# Patient Record
Sex: Female | Born: 1935 | Race: Black or African American | Hispanic: No | Marital: Married | Smoking: Former smoker
Health system: Southern US, Community
[De-identification: ages and names within clinical notes are randomized; demographics above are authoritative.]

## PROBLEM LIST (undated history)

## (undated) DIAGNOSIS — G309 Alzheimer's disease, unspecified: Secondary | ICD-10-CM

## (undated) DIAGNOSIS — J811 Chronic pulmonary edema: Secondary | ICD-10-CM

## (undated) DIAGNOSIS — F0280 Dementia in other diseases classified elsewhere without behavioral disturbance: Secondary | ICD-10-CM

## (undated) DIAGNOSIS — M1990 Unspecified osteoarthritis, unspecified site: Secondary | ICD-10-CM

## (undated) DIAGNOSIS — H8110 Benign paroxysmal vertigo, unspecified ear: Secondary | ICD-10-CM

## (undated) DIAGNOSIS — Z8639 Personal history of other endocrine, nutritional and metabolic disease: Secondary | ICD-10-CM

## (undated) DIAGNOSIS — I10 Essential (primary) hypertension: Secondary | ICD-10-CM

## (undated) DIAGNOSIS — Q899 Congenital malformation, unspecified: Secondary | ICD-10-CM

## (undated) DIAGNOSIS — F419 Anxiety disorder, unspecified: Secondary | ICD-10-CM

## (undated) DIAGNOSIS — E785 Hyperlipidemia, unspecified: Secondary | ICD-10-CM

## (undated) DIAGNOSIS — I509 Heart failure, unspecified: Secondary | ICD-10-CM

## (undated) DIAGNOSIS — I2510 Atherosclerotic heart disease of native coronary artery without angina pectoris: Secondary | ICD-10-CM

## (undated) DIAGNOSIS — N289 Disorder of kidney and ureter, unspecified: Secondary | ICD-10-CM

## (undated) HISTORY — DX: Benign paroxysmal vertigo, unspecified ear: H81.10

## (undated) HISTORY — PX: HERNIA REPAIR: SHX51

## (undated) HISTORY — DX: Anxiety disorder, unspecified: F41.9

## (undated) HISTORY — DX: Congenital malformation, unspecified: Q89.9

## (undated) HISTORY — DX: Disorder of kidney and ureter, unspecified: N28.9

## (undated) HISTORY — DX: Dementia in other diseases classified elsewhere, unspecified severity, without behavioral disturbance, psychotic disturbance, mood disturbance, and anxiety: F02.80

## (undated) HISTORY — DX: Atherosclerotic heart disease of native coronary artery without angina pectoris: I25.10

## (undated) HISTORY — PX: PARATHYROIDECTOMY: SHX19

## (undated) HISTORY — DX: Unspecified osteoarthritis, unspecified site: M19.90

## (undated) HISTORY — DX: Hyperlipidemia, unspecified: E78.5

## (undated) HISTORY — DX: Essential (primary) hypertension: I10

## (undated) HISTORY — PX: CATARACT EXTRACTION, BILATERAL: SHX1313

## (undated) HISTORY — DX: Personal history of other endocrine, nutritional and metabolic disease: Z86.39

## (undated) HISTORY — DX: Chronic pulmonary edema: J81.1

## (undated) HISTORY — DX: Alzheimer's disease, unspecified: G30.9

---

## 1999-07-13 ENCOUNTER — Other Ambulatory Visit: Admission: RE | Admit: 1999-07-13 | Discharge: 1999-07-13 | Payer: Self-pay | Admitting: Family Medicine

## 1999-10-30 ENCOUNTER — Encounter: Payer: Self-pay | Admitting: Family Medicine

## 1999-10-30 ENCOUNTER — Encounter: Admission: RE | Admit: 1999-10-30 | Discharge: 1999-10-30 | Payer: Self-pay | Admitting: Family Medicine

## 2000-05-05 ENCOUNTER — Encounter: Payer: Self-pay | Admitting: Emergency Medicine

## 2000-05-05 ENCOUNTER — Encounter: Payer: Self-pay | Admitting: Family Medicine

## 2000-05-05 ENCOUNTER — Inpatient Hospital Stay (HOSPITAL_COMMUNITY): Admission: EM | Admit: 2000-05-05 | Discharge: 2000-05-06 | Payer: Self-pay | Admitting: Emergency Medicine

## 2000-08-05 ENCOUNTER — Other Ambulatory Visit: Admission: RE | Admit: 2000-08-05 | Discharge: 2000-08-05 | Payer: Self-pay | Admitting: Family Medicine

## 2001-06-02 ENCOUNTER — Other Ambulatory Visit: Admission: RE | Admit: 2001-06-02 | Discharge: 2001-06-02 | Payer: Self-pay | Admitting: Family Medicine

## 2001-06-07 ENCOUNTER — Encounter: Admission: RE | Admit: 2001-06-07 | Discharge: 2001-06-07 | Payer: Self-pay | Admitting: Family Medicine

## 2001-06-07 ENCOUNTER — Encounter: Payer: Self-pay | Admitting: Family Medicine

## 2002-06-12 ENCOUNTER — Encounter: Payer: Self-pay | Admitting: Cardiology

## 2002-06-12 ENCOUNTER — Inpatient Hospital Stay (HOSPITAL_COMMUNITY): Admission: EM | Admit: 2002-06-12 | Discharge: 2002-06-14 | Payer: Self-pay | Admitting: *Deleted

## 2002-06-13 ENCOUNTER — Encounter: Payer: Self-pay | Admitting: Cardiology

## 2002-07-27 ENCOUNTER — Other Ambulatory Visit: Admission: RE | Admit: 2002-07-27 | Discharge: 2002-07-27 | Payer: Self-pay | Admitting: Family Medicine

## 2003-02-02 ENCOUNTER — Inpatient Hospital Stay (HOSPITAL_COMMUNITY): Admission: EM | Admit: 2003-02-02 | Discharge: 2003-02-12 | Payer: Self-pay

## 2003-02-03 ENCOUNTER — Encounter: Payer: Self-pay | Admitting: Pulmonary Disease

## 2003-02-04 ENCOUNTER — Encounter: Payer: Self-pay | Admitting: Critical Care Medicine

## 2003-02-04 ENCOUNTER — Encounter: Payer: Self-pay | Admitting: Cardiology

## 2003-02-05 ENCOUNTER — Encounter: Payer: Self-pay | Admitting: Cardiology

## 2003-02-10 ENCOUNTER — Encounter: Payer: Self-pay | Admitting: Family Medicine

## 2004-09-03 ENCOUNTER — Ambulatory Visit: Payer: Self-pay | Admitting: Cardiology

## 2004-10-05 ENCOUNTER — Ambulatory Visit: Payer: Self-pay | Admitting: *Deleted

## 2004-10-05 ENCOUNTER — Ambulatory Visit: Payer: Self-pay

## 2005-08-30 ENCOUNTER — Ambulatory Visit: Payer: Self-pay | Admitting: Cardiology

## 2005-10-04 ENCOUNTER — Ambulatory Visit: Payer: Self-pay | Admitting: Cardiology

## 2006-09-23 ENCOUNTER — Ambulatory Visit: Payer: Self-pay | Admitting: Cardiology

## 2006-09-23 LAB — CONVERTED CEMR LAB
Bilirubin Urine: NEGATIVE
Crystals: NEGATIVE
Nitrite: NEGATIVE
RBC / HPF: NONE SEEN
Total Protein, Urine: 30 mg/dL — AB
pH: 5.5 (ref 5.0–8.0)

## 2007-04-04 ENCOUNTER — Ambulatory Visit: Payer: Self-pay | Admitting: Cardiology

## 2007-04-12 ENCOUNTER — Ambulatory Visit: Payer: Self-pay | Admitting: Cardiology

## 2007-04-12 LAB — CONVERTED CEMR LAB
Calcium: 10 mg/dL (ref 8.4–10.5)
Chloride: 103 meq/L (ref 96–112)
GFR calc Af Amer: 55 mL/min
GFR calc non Af Amer: 45 mL/min
Glucose, Bld: 129 mg/dL — ABNORMAL HIGH (ref 70–99)
Sodium: 141 meq/L (ref 135–145)

## 2007-05-10 ENCOUNTER — Ambulatory Visit: Payer: Self-pay | Admitting: Cardiology

## 2007-05-10 LAB — CONVERTED CEMR LAB
BUN: 19 mg/dL (ref 6–23)
CO2: 30 meq/L (ref 19–32)
Calcium: 9.8 mg/dL (ref 8.4–10.5)
Chloride: 109 meq/L (ref 96–112)
Creatinine, Ser: 1.2 mg/dL (ref 0.4–1.2)
GFR calc non Af Amer: 45 mL/min
Sodium: 146 meq/L — ABNORMAL HIGH (ref 135–145)

## 2007-09-25 ENCOUNTER — Ambulatory Visit: Payer: Self-pay | Admitting: Cardiology

## 2007-11-20 ENCOUNTER — Ambulatory Visit: Payer: Self-pay | Admitting: Cardiology

## 2007-11-20 LAB — CONVERTED CEMR LAB
ALT: 18 units/L (ref 0–35)
AST: 27 units/L (ref 0–37)
Albumin: 3.9 g/dL (ref 3.5–5.2)
Alkaline Phosphatase: 38 units/L — ABNORMAL LOW (ref 39–117)
Bilirubin, Direct: 0.1 mg/dL (ref 0.0–0.3)
Total Bilirubin: 1 mg/dL (ref 0.3–1.2)

## 2008-04-01 ENCOUNTER — Encounter: Payer: Self-pay | Admitting: Cardiology

## 2008-04-02 ENCOUNTER — Ambulatory Visit: Payer: Self-pay | Admitting: Cardiology

## 2008-04-02 LAB — CONVERTED CEMR LAB
AST: 23 units/L (ref 0–37)
Albumin: 4.2 g/dL (ref 3.5–5.2)
CO2: 30 meq/L (ref 19–32)
GFR calc Af Amer: 39 mL/min
Glucose, Bld: 91 mg/dL (ref 70–99)
Potassium: 4.7 meq/L (ref 3.5–5.1)
Sodium: 143 meq/L (ref 135–145)

## 2008-10-07 ENCOUNTER — Ambulatory Visit: Payer: Self-pay | Admitting: Cardiology

## 2008-10-07 LAB — CONVERTED CEMR LAB
CO2: 32 meq/L (ref 19–32)
Calcium: 9.7 mg/dL (ref 8.4–10.5)
Chloride: 104 meq/L (ref 96–112)
Creatinine, Ser: 1.7 mg/dL — ABNORMAL HIGH (ref 0.4–1.2)
GFR calc Af Amer: 37 mL/min
Potassium: 4.5 meq/L (ref 3.5–5.1)
Sodium: 142 meq/L (ref 135–145)

## 2008-11-07 DIAGNOSIS — E785 Hyperlipidemia, unspecified: Secondary | ICD-10-CM

## 2008-11-07 DIAGNOSIS — I5022 Chronic systolic (congestive) heart failure: Secondary | ICD-10-CM

## 2008-12-11 ENCOUNTER — Encounter: Admission: RE | Admit: 2008-12-11 | Discharge: 2008-12-11 | Payer: Self-pay | Admitting: *Deleted

## 2009-04-17 ENCOUNTER — Ambulatory Visit: Payer: Self-pay | Admitting: Cardiology

## 2009-04-17 DIAGNOSIS — I10 Essential (primary) hypertension: Secondary | ICD-10-CM

## 2009-04-30 ENCOUNTER — Telehealth: Payer: Self-pay | Admitting: Cardiology

## 2009-07-15 ENCOUNTER — Encounter: Admission: RE | Admit: 2009-07-15 | Discharge: 2009-07-15 | Payer: Self-pay | Admitting: Internal Medicine

## 2010-04-27 ENCOUNTER — Ambulatory Visit: Payer: Self-pay | Admitting: Internal Medicine

## 2010-04-27 DIAGNOSIS — N183 Chronic kidney disease, stage 3 (moderate): Secondary | ICD-10-CM

## 2010-04-27 DIAGNOSIS — M109 Gout, unspecified: Secondary | ICD-10-CM

## 2010-04-27 LAB — CONVERTED CEMR LAB
BUN: 41 mg/dL — ABNORMAL HIGH (ref 6–23)
CO2: 31 meq/L (ref 19–32)
Chloride: 109 meq/L (ref 96–112)
GFR calc non Af Amer: 28.7 mL/min (ref >60)
Glucose, Bld: 101 mg/dL — ABNORMAL HIGH (ref 70–99)
Potassium: 4.5 meq/L (ref 3.5–5.1)
Sodium: 147 meq/L — ABNORMAL HIGH (ref 135–145)

## 2010-05-01 ENCOUNTER — Telehealth: Payer: Self-pay | Admitting: Internal Medicine

## 2010-05-14 ENCOUNTER — Ambulatory Visit: Payer: Self-pay | Admitting: Cardiology

## 2010-05-14 ENCOUNTER — Telehealth: Payer: Self-pay | Admitting: Internal Medicine

## 2010-08-17 IMAGING — US US RENAL
1 series · 14 of 25 positions shown · non-contrast
Comparison: None

CLINICAL DATA: Abnormal renal laboratory values, hypertension

RENAL/URINARY TRACT ULTRASOUND COMPLETE

[Series 1: us renal · 0.23mm/px · 14 of 49 slices shown]
[im 1/49]
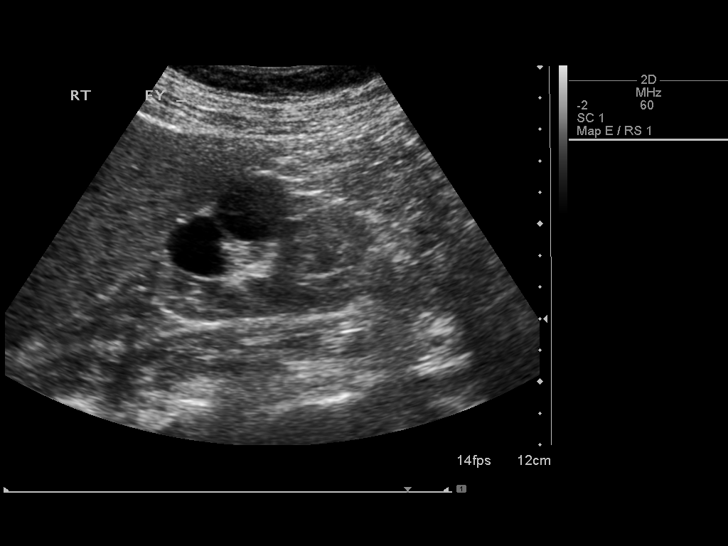
[im 5/49]
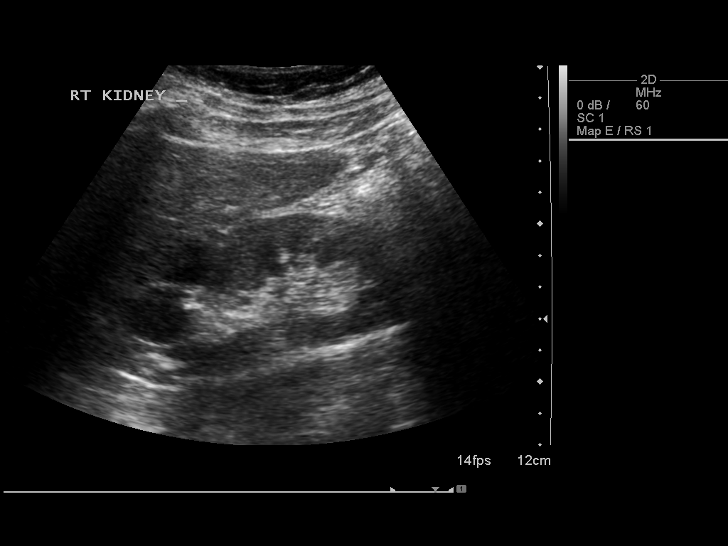
[im 9/49]
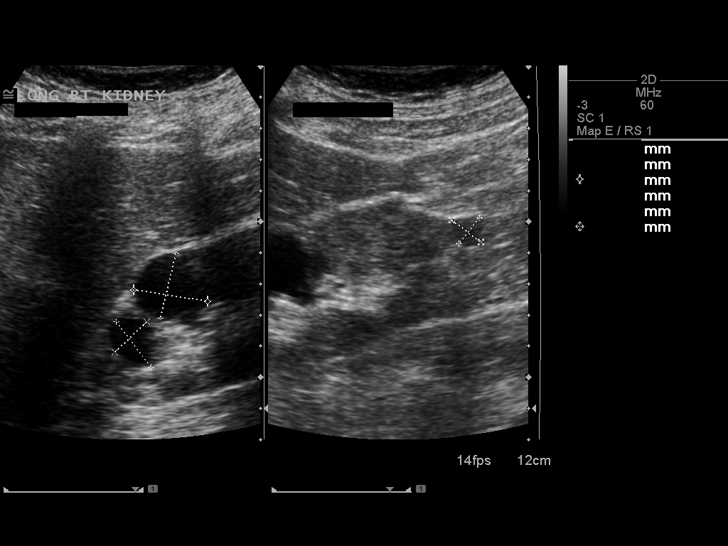
[im 13/49]
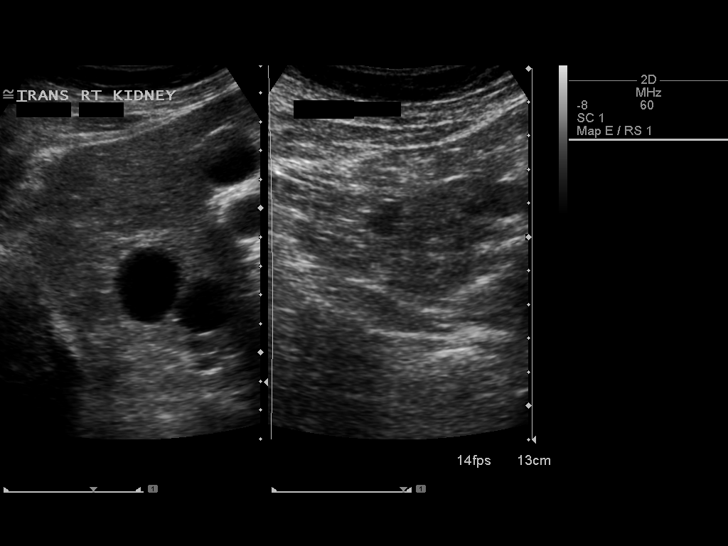
[im 17/49]
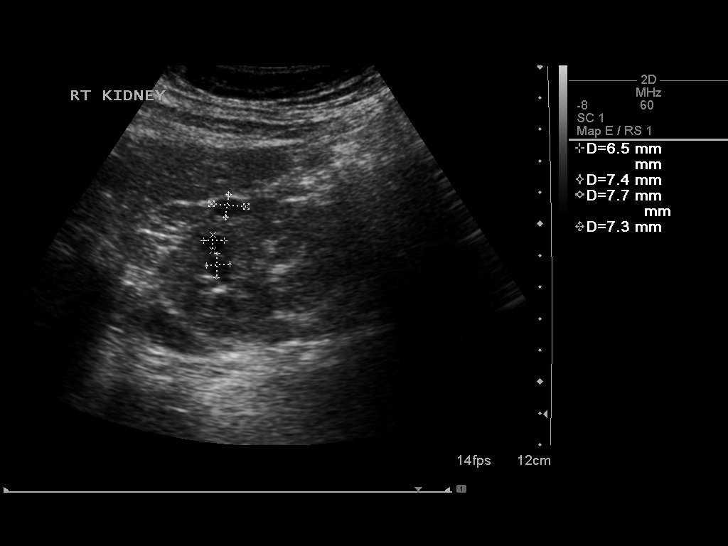
[im 19/49]
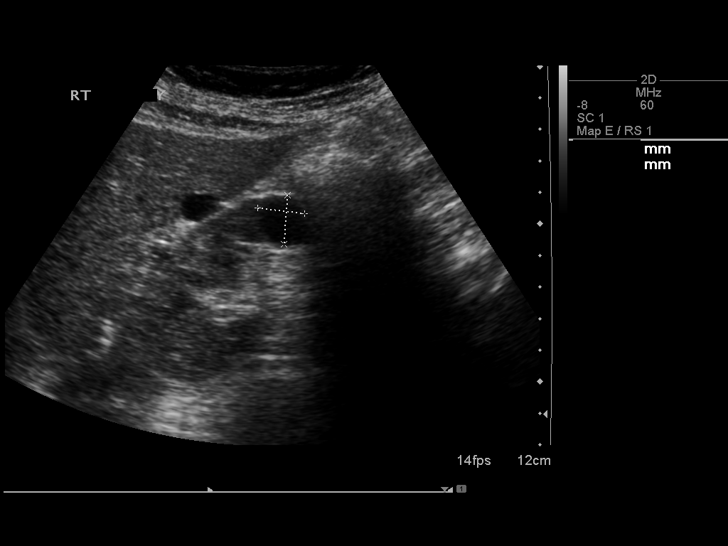
[im 23/49]
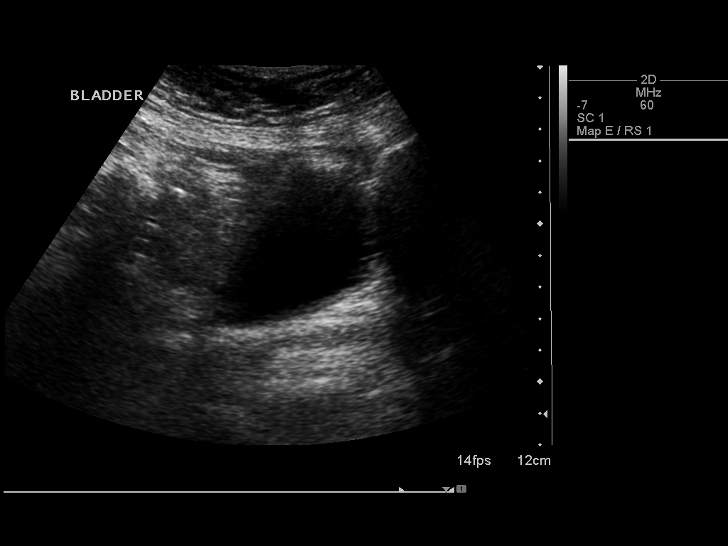
[im 27/49]
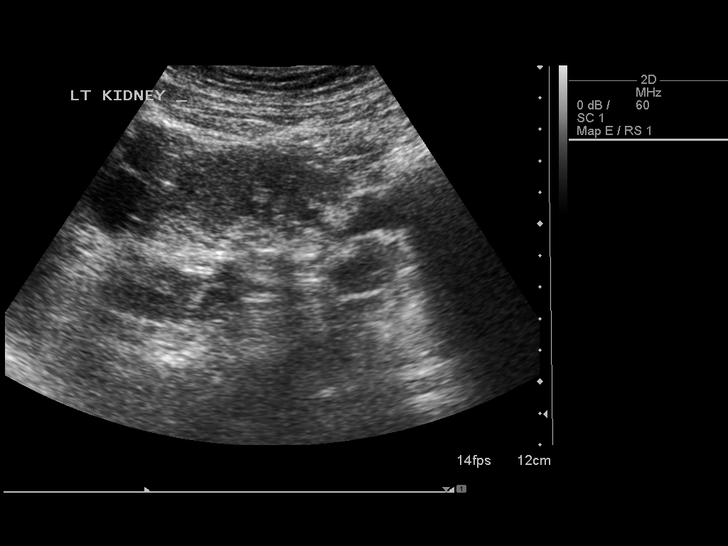
[im 31/49]
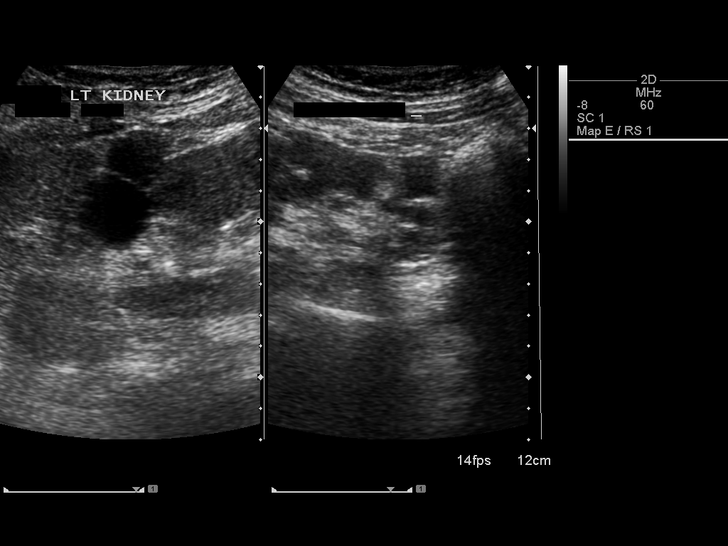
[im 33/49]
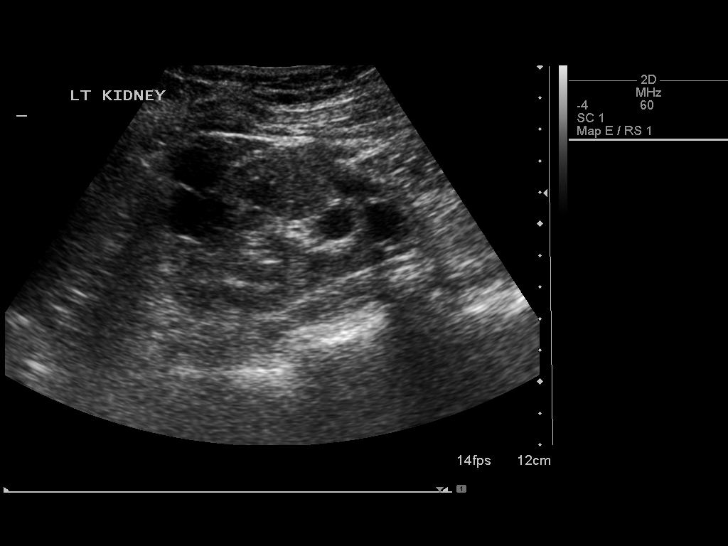
[im 37/49]
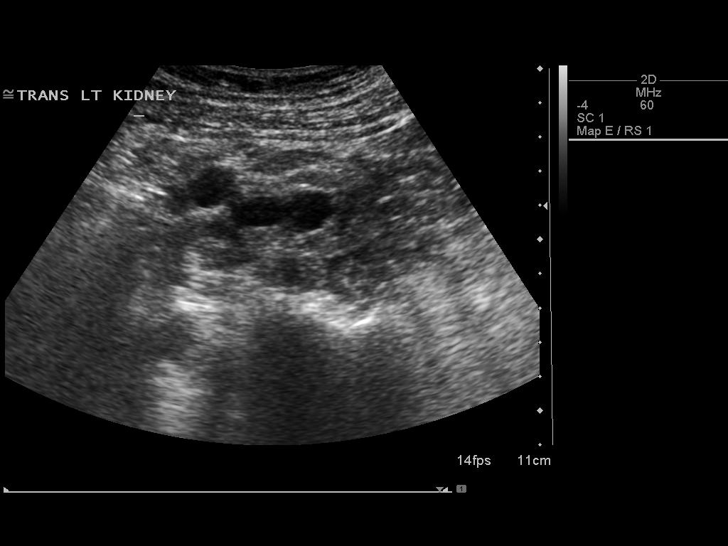
[im 41/49]
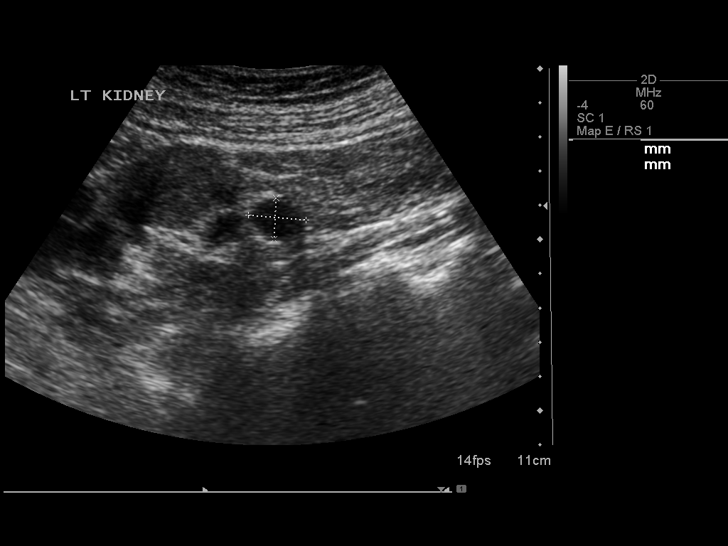
[im 45/49]
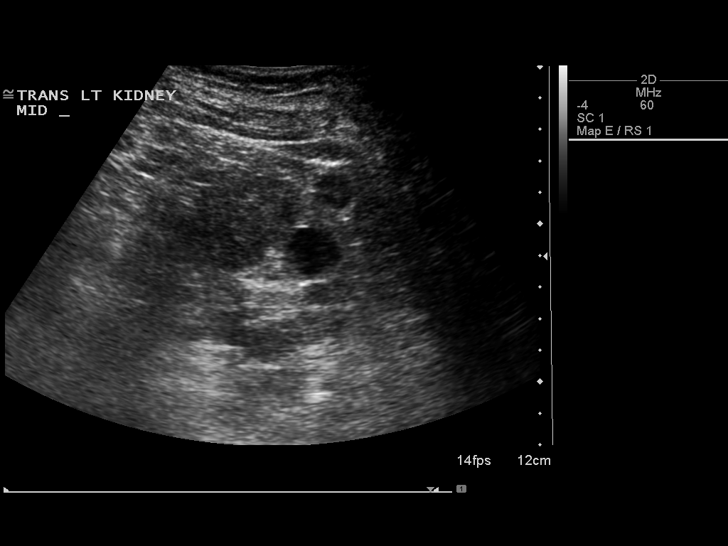
[im 49/49]
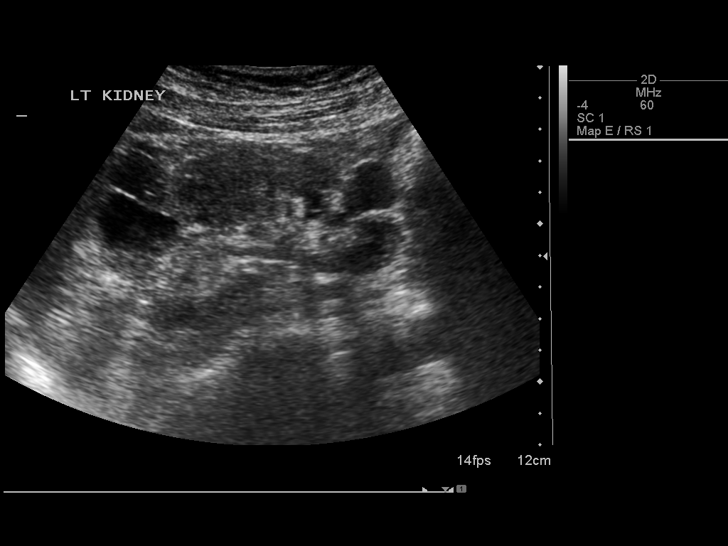

[14 of 25 positions shown; findings below may reference images not displayed]

FINDINGS: Right Kidney:  No hydronephrosis is seen.  The right kidney
measures 9.3 cm sagittally.  Multiple renal cysts are noted the
largest in the upper pole measuring 2.4 x 2.1 x 2.2 cm.

Left Kidney:  No hydronephrosis.  The left kidney measures 9.7 cm.
Multiple renal cysts are noted, largest in the upper pole of 2.7 x
2.4 x 2.4 cm.  No solid renal lesion is seen.

Bladder:  The urinary bladder is unremarkable
IMPRESSION: .
1.  No hydronephrosis.
2.  Multiple bilateral renal cysts.  No solid renal lesion is seen.

## 2010-09-15 NOTE — Progress Notes (Signed)
  Phone Note Refill Request Message from:  Fax from Pharmacy on May 01, 2010 4:21 PM  Refills Requested: Medication #1:  LORAZEPAM 0.5 MG TABS as needed   Supply Requested: 6 months  Medication #2:  TRAZODONE HCL 50 MG TABS as needed   Supply Requested: 6 months Please Advise refill for pt  Initial call taken by: Ami Bullins CMA,  May 01, 2010 4:22 PM  Follow-up for Phone Call        ok Follow-up by: Etta Grandchild MD,  May 01, 2010 4:22 PM  Additional Follow-up for Phone Call Additional follow up Details #1::        How many fills ;for 6 months Additional Follow-up by: Ami Bullins CMA,  May 01, 2010 4:24 PM    Prescriptions: TRAZODONE HCL 50 MG TABS (TRAZODONE HCL) as needed  #30 x 3   Entered by:   Ami Bullins CMA   Authorized by:   Etta Grandchild MD   Signed by:   Bill Salinas CMA on 05/01/2010   Method used:   Telephoned to ...       [facility]       [address]       [place], [place]  [address]       Ph: [phone]       Fax: [phone]   RxID:   [id number] LORAZEPAM 0.5 MG TABS (LORAZEPAM) as needed  #30 x 3   Entered by:   Bill Salinas CMA   Authorized by:   Etta Grandchild MD   Signed by:   Bill Salinas CMA on 05/01/2010   Method used:   Telephoned to ...       [facility]       [address]       [place], [place]  [address]       Ph: [phone]       Fax: [phone]   RxID:   [id number]

## 2010-09-15 NOTE — Progress Notes (Signed)
  Phone Note Refill Request Message from:  Fax from Pharmacy on May 01, 2010 4:20 PM  Refills Requested: Medication #1:  NAMENDA 10 MG TABS daily  Medication #2:  ARICEPT 10 MG TABS daily  Medication #3:  LEXAPRO 10 MG TABS Take 1 tablet by mouth once a day. Initial call taken by: Ami Bullins CMA,  May 01, 2010 4:21 PM    Prescriptions: LEXAPRO 10 MG TABS (ESCITALOPRAM OXALATE) Take 1 tablet by mouth once a day  #30 x 6   Entered by:   Ami Bullins CMA   Authorized by:   Etta Grandchild MD   Signed by:   Bill Salinas CMA on 05/01/2010   Method used:   Electronically to        [facility] (retail)       [address]       [place], [place]  [address]       Ph: [phone]       Fax: [phone]   RxID:   [id number] ARICEPT 10 MG TABS (DONEPEZIL HCL) daily  #30 x 6   Entered by:   Bill Salinas CMA   Authorized by:   Etta Grandchild MD   Signed by:   Bill Salinas CMA on 05/01/2010   Method used:   Electronically to        [facility] (retail)       [address]       [place], [place]  [address]       Ph: [phone]       Fax: [phone]   RxID:   [id number] NAMENDA 10 MG TABS (MEMANTINE HCL) daily  #30 x 6   Entered by:   Bill Salinas CMA   Authorized by:   Etta Grandchild MD   Signed by:   Bill Salinas CMA on 05/01/2010   Method used:   Electronically to        [facility] (retail)       [address]       [place], [place]  [address]       Ph: [phone]       Fax: [phone]   RxID:   [id number]

## 2010-09-15 NOTE — Progress Notes (Signed)
Summary: NAMENDA  Phone Note Call from Patient   Caller: Daughter - Derek Jack [address] 2002 Summary of Call: Pt's daughter called, pt takes namenda  1 two times a day, Ok to update EMR and call in new rx?   Dtr is req a call when complete Initial call taken by: Lamar Sprinkles, CMA,  May 14, 2010 12:11 PM  Follow-up for Phone Call        yes Follow-up by: Etta Grandchild MD,  May 14, 2010 12:16 PM    Additional Follow-up for Phone Call Additional follow up Details #2::    Left vm for daughter to check w/pharm Follow-up by: Lamar Sprinkles, CMA,  May 14, 2010 2:05 PM  New/Updated Medications: NAMENDA 10 MG TABS (MEMANTINE HCL) 1 two times a day Prescriptions: NAMENDA 10 MG TABS (MEMANTINE HCL) 1 two times a day  #180 x 1   Entered by:   Lamar Sprinkles, CMA   Authorized by:   Etta Grandchild MD   Signed by:   Lamar Sprinkles, CMA on 05/14/2010   Method used:   Electronically to        [facility] (ret[facility])       [address]       [place], [place]  [address]       Ph: [phone]       Fax: [phone]   RxID:   [id number]

## 2010-09-15 NOTE — Assessment & Plan Note (Signed)
Summary: New / Medicare / # / cd   Vital Signs:  Patient profile:   77 year old female Height:      63 inches Weight:      153.75 pounds BMI:     27.33 O2 Sat:      96 % on Room air Temp:     99.1 degrees F oral Pulse rate:   62 / minute Pulse rhythm:   regular Resp:     16 per minute BP sitting:   110 / 58  (left arm) Cuff size:   large  Vitals Entered By: Rock Nephew CMA (April 27, 2010 2:48 PM)  O2 Flow:  Room air  Primary Care Provider:  Etta Grandchild MD   History of Present Illness:  Follow-Up Visit      This is an 77 year old woman who presents for Follow-up visit.  The patient denies chest pain, palpitations, dizziness, syncope, edema, SOB, DOE, PND, and orthopnea.  Since the last visit the patient notes no new problems or concerns.  The patient reports taking meds as prescribed, monitoring BP, and dietary compliance.  When questioned about possible medication side effects, the patient notes none.    Preventive Screening-Counseling & Management  Alcohol-Tobacco     Alcohol drinks/day: 0     Smoking Status: quit > 6 months     Year Quit: 1980     Tobacco Counseling: to remain off tobacco products  Hep-HIV-STD-Contraception     Hepatitis Risk: no risk noted     HIV Risk: no risk noted     STD Risk: no risk noted      Sexual History:  none.        Drug Use:  never.        Blood Transfusions:  no.    Problems Prior to Update: 1)  Renal Insufficiency  (ICD-588.9) 2)  Gout  (ICD-274.9) 3)  Essential Hypertension, Benign  (ICD-401.1) 4)  Hyperlipidemia-mixed  (ICD-272.4) 5)  Systolic Heart Failure, Chronic  (ICD-428.22)  Medications Prior to Update: 1)  Spironolactone 25 Mg Tabs (Spironolactone) .Marland Kitchen.. 1 By Mouth Daily 2)  Furosemide 40 Mg Tabs (Furosemide) .Marland Kitchen.. 1 By Mouth Daily 3)  Benazepril Hcl 40 Mg Tabs (Benazepril Hcl) .Marland Kitchen.. 1 By Mouth Daily 4)  Namenda 10 Mg Tabs (Memantine Hcl) .... Daily 5)  Aricept 10 Mg Tabs (Donepezil Hcl) .... Daily 6)   Simvastatin 80 Mg Tabs (Simvastatin) .Marland Kitchen.. 1 By Mouth Daily 7)  Bisoprolol Fumarate 10 Mg Tabs (Bisoprolol Fumarate) .Marland Kitchen.. 1 By Mouth Daily 8)  Lorazepam 0.5 Mg Tabs (Lorazepam) .... As Needed 9)  Trazodone Hcl 50 Mg Tabs (Trazodone Hcl) .... As Needed 10)  Zantac 75 75 Mg Tabs (Ranitidine Hcl) .... Daily 11)  Aspirin 81 Mg  Tabs (Aspirin) .... Daily 12)  Multivitamins   Tabs (Multiple Vitamin) .... Daily  Current Medications (verified): 1)  Furosemide 40 Mg Tabs (Furosemide) .Marland Kitchen.. 1 By Mouth Daily 2)  Benazepril Hcl 40 Mg Tabs (Benazepril Hcl) .Marland Kitchen.. 1 By Mouth Daily 3)  Namenda 10 Mg Tabs (Memantine Hcl) .... Daily 4)  Aricept 10 Mg Tabs (Donepezil Hcl) .... Daily 5)  Simvastatin 80 Mg Tabs (Simvastatin) .Marland Kitchen.. 1 By Mouth Daily 6)  Bisoprolol Fumarate 10 Mg Tabs (Bisoprolol Fumarate) .Marland Kitchen.. 1 By Mouth Daily 7)  Lorazepam 0.5 Mg Tabs (Lorazepam) .... As Needed 8)  Trazodone Hcl 50 Mg Tabs (Trazodone Hcl) .... As Needed 9)  Zantac 75 75 Mg Tabs (Ranitidine Hcl) .... Daily 10)  Aspirin 81 Mg  Tabs (Aspirin) .... Daily 11)  Multivitamins   Tabs (Multiple Vitamin) .... Daily 12)  Allopurinol 100 Mg Tabs (Allopurinol) .... Take 1 Tablet By Mouth Once A Day 13)  Lexapro 10 Mg Tabs (Escitalopram Oxalate) .... Take 1 Tablet By Mouth Once A Day  Allergies (verified): No Known Drug Allergies  Past History:  Past Surgical History: Last updated: 12/05/08 C-Section Hernia repair s/p parathyroidectomy  Family History: Last updated: 12-05-08 Father deceased with ? etiol. CVA Mother sudden death age 70  Social History: Last updated: 12-05-2008 Lives alone Remote 50pk yr smoking hx  Risk Factors: Alcohol Use: 0 (04/27/2010)  Risk Factors: Smoking Status: quit > 6 months (04/27/2010)  Past Medical History: 1. Non-Ischemic CM, EF=25-35% (echo 6/04) 2. CAD (non-obstructive) 3. Hypertension 4. Mild Alzheimer's dementia 5. Benign positional vertigo. 6. A history of hypoparathyroidism  (s/p parathyroidectomy). 7. Status post bilateral cataract removal. 8. Degenerative joint disease. 9. Mild dyslipidemia 10. s/p ventilator-dependent respiratory failure/pulmonary     edema/cardiac enzymes show no distinct pattern or elevation (6/04) Gout Renal insufficiency  Social History: Smoking Status:  quit > 6 months Hepatitis Risk:  no risk noted HIV Risk:  no risk noted STD Risk:  no risk noted Sexual History:  none Drug Use:  never Blood Transfusions:  no  Review of Systems  The patient denies anorexia, fever, weight loss, weight gain, chest pain, syncope, dyspnea on exertion, peripheral edema, prolonged cough, headaches, hemoptysis, abdominal pain, hematuria, difficulty walking, depression, enlarged lymph nodes, angioedema, and breast masses.   MS:  Denies joint pain, joint redness, joint swelling, and loss of strength.  Physical Exam  General:  alert, well-developed, well-nourished, well-hydrated, appropriate dress, normal appearance, and healthy-appearing.   Head:  normocephalic, atraumatic, no abnormalities observed, and no abnormalities palpated.   Eyes:  vision grossly intact, pupils equal, pupils round, and pupils reactive to light.   Mouth:  Oral mucosa and oropharynx without lesions or exudates.  Teeth in good repair. Neck:  supple, full ROM, no masses, no thyromegaly, no JVD, normal carotid upstroke, no carotid bruits, no cervical lymphadenopathy, and no neck tenderness.   Lungs:  normal respiratory effort, no intercostal retractions, no accessory muscle use, normal breath sounds, no dullness, no fremitus, no crackles, and no wheezes.   Heart:  normal rate, regular rhythm, no murmur, no gallop, no rub, and no JVD.   Abdomen:  soft, non-tender, normal bowel sounds, no distention, no masses, no guarding, no rigidity, no rebound tenderness, no abdominal hernia, no inguinal hernia, no hepatomegaly, and no splenomegaly.   Msk:  normal ROM, no joint tenderness, no joint  swelling, no joint warmth, no redness over joints, no joint deformities, no joint instability, and no crepitation.   Pulses:  pulses normal in all 4 extremities Extremities:  No clubbing or cyanosis. Neurologic:  Alert and oriented x 3. Skin:  turgor normal, color normal, no rashes, no suspicious lesions, no ecchymoses, no petechiae, no purpura, no ulcerations, and no edema.   Cervical Nodes:  no anterior cervical adenopathy and no posterior cervical adenopathy.   Axillary Nodes:  no R axillary adenopathy and no L axillary adenopathy.   Inguinal Nodes:  no R inguinal adenopathy and no L inguinal adenopathy.   Psych:  normally interactive, good eye contact, not anxious appearing, not depressed appearing, flat affect, easily distracted, poor concentration, and memory impairment.     Impression & Recommendations:  Problem # 1:  RENAL INSUFFICIENCY (ICD-588.9) Assessment Unchanged  Orders: Venipuncture (04540)  TLB-BMP (Basic Metabolic Panel-BMET) (80048-METABOL) TLB-Uric Acid, Blood (84550-URIC)  Problem # 2:  GOUT (ICD-274.9) Assessment: Unchanged  Her updated medication list for this problem includes:    Allopurinol 100 Mg Tabs (Allopurinol) .Marland Kitchen... Take 1 tablet by mouth once a day  Orders: Venipuncture ([address]109) TLB-BMP (Basic Metabolic Panel-BMET) (80048-METABOL) TLB-Uric Acid, Blood (84550-URIC)  Problem # 3:  ESSENTIAL HYPERTENSION, BENIGN (ICD-401.1) Assessment: Improved  The following medications were removed from the medication list:    Spironolactone 25 Mg Tabs (Spironolactone) .Marland Kitchen... 1 by mouth daily Her updated medication list for this problem includes:    Furosemide 40 Mg Tabs (Furosemide) .Marland Kitchen... 1 by mouth daily    Benazepril Hcl 40 Mg Tabs (Benazepril hcl) .Marland Kitchen... 1 by mouth daily    Bisoprolol Fumarate 10 Mg Tabs (Bisoprolol fumarate) .Marland Kitchen... 1 by mouth daily  Orders: Venipuncture (60454) TLB-BMP (Basic Metabolic Panel-BMET) (80048-METABOL) TLB-Uric Acid, Blood  (84550-URIC)  BP today: 110/58 Prior BP: 125/6 (04/17/2009)  Labs Reviewed: K+: 4.5 (10/07/2008) Creat: : 1.7 (10/07/2008)   Chol: 117 (11/20/2007)   HDL: 50.3 (11/20/2007)   LDL: 56 (11/20/2007)   TG: 51 (11/20/2007)  Complete Medication List: 1)  Furosemide 40 Mg Tabs (Furosemide) .Marland Kitchen.. 1 by mouth daily 2)  Benazepril Hcl 40 Mg Tabs (Benazepril hcl) .Marland Kitchen.. 1 by mouth daily 3)  Namenda 10 Mg Tabs (Memantine hcl) .... Daily 4)  Aricept 10 Mg Tabs (Donepezil hcl) .... Daily 5)  Simvastatin 80 Mg Tabs (Simvastatin) .Marland Kitchen.. 1 by mouth daily 6)  Bisoprolol Fumarate 10 Mg Tabs (Bisoprolol fumarate) .Marland Kitchen.. 1 by mouth daily 7)  Lorazepam 0.5 Mg Tabs (Lorazepam) .... As needed 8)  Trazodone Hcl 50 Mg Tabs (Trazodone hcl) .... As needed 9)  Zantac 75 75 Mg Tabs (Ranitidine hcl) .... Daily 10)  Aspirin 81 Mg Tabs (Aspirin) .... Daily 11)  Multivitamins Tabs (Multiple vitamin) .... Daily 12)  Allopurinol 100 Mg Tabs (Allopurinol) .... Take 1 tablet by mouth once a day 13)  Lexapro 10 Mg Tabs (Escitalopram oxalate) .... Take 1 tablet by mouth once a day  Patient Instructions: 1)  Please schedule a follow-up appointment in 3 months.

## 2010-09-15 NOTE — Assessment & Plan Note (Signed)
Summary: 1 year fu appt/mt  Medications Added ARICEPT 10 MG TABS (DONEPEZIL HCL) 1 by mouth two times a day SUPER PROBIOTIC  CAPS (PROBIOTIC PRODUCT) 1 by mouth daily IMODIUM A-D 2 MG TABS (LOPERAMIDE HCL) as needed      Allergies Added: NKDA  Visit Type:  Follow-up Primary Provider:  Etta Grandchild MD  CC:  Cardiomyopathy.  History of Present Illness: The patient presents for follow up of her cardiomyopathy. Since I last saw her she has done well. She lives with her daughter. She gets along well with her chores of daily living. She doesn't have any shortness of breath, PND or orthopnea. She's had no chest pressure, neck or arm discomfort. She's had no weight gain or edema. I do note that she has had mildly progressive renal insufficiency. She did see a nephrologist but no further testing was indicated.  Current Medications (verified): 1)  Furosemide 40 Mg Tabs (Furosemide) .Marland Kitchen.. 1 By Mouth Daily 2)  Benazepril Hcl 40 Mg Tabs (Benazepril Hcl) .Marland Kitchen.. 1 By Mouth Daily 3)  Namenda 10 Mg Tabs (Memantine Hcl) .... Daily 4)  Aricept 10 Mg Tabs (Donepezil Hcl) .Marland Kitchen.. 1 By Mouth Two Times A Day 5)  Simvastatin 80 Mg Tabs (Simvastatin) .Marland Kitchen.. 1 By Mouth Daily 6)  Bisoprolol Fumarate 10 Mg Tabs (Bisoprolol Fumarate) .Marland Kitchen.. 1 By Mouth Daily 7)  Lorazepam 0.5 Mg Tabs (Lorazepam) .... As Needed 8)  Trazodone Hcl 50 Mg Tabs (Trazodone Hcl) .... As Needed 9)  Zantac 75 75 Mg Tabs (Ranitidine Hcl) .... Daily 10)  Aspirin 81 Mg  Tabs (Aspirin) .... Daily 11)  Multivitamins   Tabs (Multiple Vitamin) .... Daily 12)  Allopurinol 100 Mg Tabs (Allopurinol) .... Take 1 Tablet By Mouth Once A Day 13)  Lexapro 10 Mg Tabs (Escitalopram Oxalate) .... Take 1 Tablet By Mouth Once A Day 14)  Super Probiotic  Caps (Probiotic Product) .Marland Kitchen.. 1 By Mouth Daily 15)  Imodium A-D 2 Mg Tabs (Loperamide Hcl) .... As Needed  Allergies (verified): No Known Drug Allergies  Past History:  Past Medical History: Reviewed history  from 04/27/2010 and no changes required. 1. Non-Ischemic CM, EF=25-35% (echo 6/04) 2. CAD (non-obstructive) 3. Hypertension 4. Mild Alzheimer's dementia 5. Benign positional vertigo. 6. A history of hypoparathyroidism (s/p parathyroidectomy). 7. Status post bilateral cataract removal. 8. Degenerative joint disease. 9. Mild dyslipidemia 10. s/p ventilator-dependent respiratory failure/pulmonary     edema/cardiac enzymes show no distinct pattern or elevation (6/04) Gout Renal insufficiency  Past Surgical History: Reviewed history from 11/07/2008 and no changes required. C-Section Hernia repair s/p parathyroidectomy  Review of Systems       As stated in the HPI and negative for all other systems.   Vital Signs:  Patient profile:   77 year old female Height:      63 inches Weight:      158 pounds BMI:     28.09 Pulse rate:   52 / minute Resp:     16 per minute BP sitting:   159 / 62  (right arm)  Vitals Entered By: Marrion Coy, CNA (May 14, 2010 11:59 AM)  Physical Exam  General:  Well developed, well nourished, in no acute distress. Head:  normocephalic and atraumatic Neck:  Neck supple, no JVD. No masses, thyromegaly or abnormal cervical nodes. Chest Wall:  no deformities or breast masses noted Lungs:  Clear bilaterally to auscultation and percussion. Heart:  Non-displaced PMI, chest non-tender; regular rate and rhythm, S1, S2 without murmurs, rubs  or gallops. Carotid upstroke normal, no bruit. Normal abdominal aortic size, no bruits. Femorals normal pulses, no bruits. Pedals normal pulses. No edema, no varicosities. Abdomen:  Bowel sounds positive; abdomen soft and non-tender without masses, organomegaly, or hernias noted. No hepatosplenomegaly. Msk:  Back normal, normal gait. Muscle strength and tone normal. Extremities:  No clubbing or cyanosis. Neurologic:  Pleasantly confused, otherwise intact Skin:  Intact without lesions or rashes. Cervical Nodes:  no  significant adenopathy Axillary Nodes:  no significant adenopathy Psych:  Normal affect.   EKG  Procedure date:  05/14/2010  Findings:      Sinus rhythm, rate 55, left bundle branch block, left axis deviation  Impression & Recommendations:  Problem # 1:  SYSTOLIC HEART FAILURE, CHRONIC (ICD-428.22) The patient seems to be euvolemic.  She will continue with the meds as listed. No change in therapy is indicated. Orders: EKG w/ Interpretation (93000)  Problem # 2:  RENAL INSUFFICIENCY (ICD-588.9) I spent a long time discussing with the patient and her daughter renal insufficiency and what these labs meant.  Problem # 3:  ESSENTIAL HYPERTENSION, BENIGN (ICD-401.1) Her blood pressure is controlled. She will continue the meds as listed. Orders: EKG w/ Interpretation (93000)  Patient Instructions: 1)  Your physician recommends that you schedule a follow-up appointment in: 12 months with Dr Antoine Poche 2)  Your physician recommends that you continue on your current medications as directed. Please refer to the Current Medication list given to you today.

## 2010-10-05 ENCOUNTER — Telehealth: Payer: Self-pay | Admitting: Internal Medicine

## 2010-10-13 ENCOUNTER — Telehealth: Payer: Self-pay | Admitting: Internal Medicine

## 2010-10-13 NOTE — Progress Notes (Signed)
Summary: lorazepam refill  Phone Note Refill Request Message from:  Fax from Pharmacy on October 05, 2010 1:23 PM  Refills Requested: Medication #1:  LORAZEPAM 0.5 MG TABS as needed   Dosage confirmed as above?Dosage Confirmed   Last Refilled: 08/30/2010   Notes: last rx written 05/01/10 #30/3rf Is this ok to refill    Method Requested: Telephone to Pharmacy Initial call taken by: Rock Nephew CMA,  October 05, 2010 1:24 PM  Follow-up for Phone Call        no, she is due for f/up Follow-up by: Etta Grandchild MD,  October 05, 2010 1:35 PM  Additional Follow-up for Phone Call Additional follow up Details #1::        denied, pharmacy notified via fax.Alvy Beal Archie CMA  October 06, 2010 11:29 AM

## 2010-10-15 ENCOUNTER — Ambulatory Visit (INDEPENDENT_AMBULATORY_CARE_PROVIDER_SITE_OTHER): Payer: Medicare Other | Admitting: Internal Medicine

## 2010-10-15 ENCOUNTER — Other Ambulatory Visit: Payer: Medicare Other

## 2010-10-15 ENCOUNTER — Encounter: Payer: Self-pay | Admitting: Internal Medicine

## 2010-10-15 ENCOUNTER — Other Ambulatory Visit: Payer: Self-pay | Admitting: Internal Medicine

## 2010-10-15 DIAGNOSIS — N259 Disorder resulting from impaired renal tubular function, unspecified: Secondary | ICD-10-CM

## 2010-10-15 DIAGNOSIS — E785 Hyperlipidemia, unspecified: Secondary | ICD-10-CM

## 2010-10-15 DIAGNOSIS — F411 Generalized anxiety disorder: Secondary | ICD-10-CM

## 2010-10-15 DIAGNOSIS — I10 Essential (primary) hypertension: Secondary | ICD-10-CM

## 2010-10-15 DIAGNOSIS — M109 Gout, unspecified: Secondary | ICD-10-CM

## 2010-10-15 DIAGNOSIS — I5022 Chronic systolic (congestive) heart failure: Secondary | ICD-10-CM

## 2010-10-15 LAB — BASIC METABOLIC PANEL
BUN: 37 mg/dL — ABNORMAL HIGH (ref 6–23)
Calcium: 9.1 mg/dL (ref 8.4–10.5)
GFR: 34.06 mL/min — ABNORMAL LOW (ref >60.00)
Potassium: 4.4 mEq/L (ref 3.5–5.1)
Sodium: 142 mEq/L (ref 135–145)

## 2010-10-15 LAB — URINALYSIS, ROUTINE W REFLEX MICROSCOPIC
Hgb urine dipstick: NEGATIVE
Nitrite: NEGATIVE
Urobilinogen, UA: 0.2 (ref 0.0–1.0)

## 2010-10-15 LAB — CBC WITH DIFFERENTIAL/PLATELET
Basophils Relative: 0.3 % (ref 0.0–3.0)
Eosinophils Absolute: 0.1 10*3/uL (ref 0.0–0.7)
Hemoglobin: 11.7 g/dL — ABNORMAL LOW (ref 12.0–15.0)
Lymphs Abs: 2 10*3/uL (ref 0.7–4.0)
MCHC: 34.4 g/dL (ref 30.0–36.0)
MCV: 95.2 fl (ref 78.0–100.0)
Monocytes Absolute: 0.4 10*3/uL (ref 0.1–1.0)
Neutro Abs: 3 10*3/uL (ref 1.4–7.7)
RBC: 3.57 Mil/uL — ABNORMAL LOW (ref 3.87–5.11)

## 2010-10-15 LAB — LIPID PANEL
HDL: 53.1 mg/dL (ref >39.00)
LDL Cholesterol: 40 mg/dL (ref 0–99)
VLDL: 23.2 mg/dL (ref 0.0–40.0)

## 2010-10-15 LAB — CONVERTED CEMR LAB
HDL goal, serum: 40 mg/dL
LDL Goal: 130 mg/dL

## 2010-10-15 LAB — HEPATIC FUNCTION PANEL
Albumin: 4 g/dL (ref 3.5–5.2)
Total Protein: 6.8 g/dL (ref 6.0–8.3)

## 2010-10-22 NOTE — Assessment & Plan Note (Signed)
Summary: follow up/ med mefill   Vital Signs:  Patient profile:   77 year old female Menstrual status:  postmenopausal Height:      63 inches Weight:      146 pounds BMI:     25.96 O2 Sat:      96 % on Room air Temp:     98.2 degrees F oral Pulse rate:   62 / minute Pulse rhythm:   regular Resp:     16 per minute BP sitting:   122 / 64  (left arm) Cuff size:   regular  Vitals Entered By: Rock Nephew CMA (October 15, 2010 1:25 PM)  O2 Flow:  Room air CC: follow-up visit// med refill, Lipid Management Is Patient Diabetic? No Pain Assessment Patient in pain? no       Does patient need assistance? Functional Status Self care Ambulation Impaired:Risk for fall     Menstrual Status postmenopausal   Primary Care Provider:  Etta Grandchild MD  CC:  follow-up visit// med refill and Lipid Management.  History of Present Illness:  Follow-Up Visit      This is an 77 year old woman who presents for Follow-up visit.  The patient denies chest pain, palpitations, dizziness, syncope, edema, SOB, DOE, PND, and orthopnea.  Since the last visit the patient notes no new problems or concerns.  The patient reports taking meds as prescribed, monitoring BP, and dietary compliance.  When questioned about possible medication side effects, the patient notes none.    Lipid Management History:      Positive NCEP/ATP III risk factors include female age 20 years old or older and hypertension.  Negative NCEP/ATP III risk factors include no history of early menopause without estrogen hormone replacement, no family history for ischemic heart disease, non-tobacco-user status, no ASHD (atherosclerotic heart disease), no prior stroke/TIA, no peripheral vascular disease, and no history of aortic aneurysm.        The patient states that she knows about the "Therapeutic Lifestyle Change" diet.  Her compliance with the TLC diet is fair.  The patient expresses understanding of adjunctive measures for  cholesterol lowering.  Adjunctive measures started by the patient include limit alcohol consumpton and weight reduction.  She expresses no side effects from her lipid-lowering medication.  The patient denies any symptoms to suggest myopathy or liver disease.    Preventive Screening-Counseling & Management  Alcohol-Tobacco     Alcohol drinks/day: 0     Alcohol Counseling: not indicated; patient does not drink     Smoking Status: quit     Year Quit: 1980     Tobacco Counseling: to remain off tobacco products  Caffeine-Diet-Exercise     Does Patient Exercise: no  Hep-HIV-STD-Contraception     Hepatitis Risk: no risk noted     HIV Risk: no risk noted     STD Risk: no risk noted      Sexual History:  none.        Drug Use:  no.        Blood Transfusions:  no.    Clinical Review Panels:  Lipid Management   Cholesterol:  117 (11/20/2007)   LDL (bad choesterol):  56 (11/20/2007)   HDL (good cholesterol):  50.3 (11/20/2007)  Diabetes Management   Creatinine:  2.1 (04/27/2010)  Complete Metabolic Panel   Glucose:  101 (04/27/2010)   Sodium:  147 (04/27/2010)   Potassium:  4.5 (04/27/2010)   Chloride:  109 (04/27/2010)   CO2:  31 (  04/27/2010)   BUN:  41 (04/27/2010)   Creatinine:  2.1 (04/27/2010)   Albumin:  4.2 (04/02/2008)   Total Protein:  7.6 (04/02/2008)   Calcium:  9.3 (04/27/2010)   Total Bili:  0.9 (04/02/2008)   Alk Phos:  44 (04/02/2008)   SGPT (ALT):  19 (04/02/2008)   SGOT (AST):  23 (04/02/2008)   Medications Prior to Update: 1)  Furosemide 40 Mg Tabs (Furosemide) .Marland Kitchen.. 1 By Mouth Daily 2)  Benazepril Hcl 40 Mg Tabs (Benazepril Hcl) .Marland Kitchen.. 1 By Mouth Daily 3)  Namenda 10 Mg Tabs (Memantine Hcl) .Marland Kitchen.. 1 Two Times A Day 4)  Aricept 10 Mg Tabs (Donepezil Hcl) .Marland Kitchen.. 1 By Mouth Two Times A Day 5)  Simvastatin 80 Mg Tabs (Simvastatin) .Marland Kitchen.. 1 By Mouth Daily 6)  Bisoprolol Fumarate 10 Mg Tabs (Bisoprolol Fumarate) .Marland Kitchen.. 1 By Mouth Daily 7)  Lorazepam 0.5 Mg Tabs  (Lorazepam) .... As Needed 8)  Trazodone Hcl 50 Mg Tabs (Trazodone Hcl) .... As Needed 9)  Zantac 75 75 Mg Tabs (Ranitidine Hcl) .... Daily 10)  Aspirin 81 Mg  Tabs (Aspirin) .... Daily 11)  Multivitamins   Tabs (Multiple Vitamin) .... Daily 12)  Allopurinol 100 Mg Tabs (Allopurinol) .... Take 1 Tablet By Mouth Once A Day 13)  Lexapro 10 Mg Tabs (Escitalopram Oxalate) .... Take 1 Tablet By Mouth Once A Day 14)  Super Probiotic  Caps (Probiotic Product) .Marland Kitchen.. 1 By Mouth Daily 15)  Imodium A-D 2 Mg Tabs (Loperamide Hcl) .... As Needed  Current Medications (verified): 1)  Furosemide 40 Mg Tabs (Furosemide) .Marland Kitchen.. 1 By Mouth Daily 2)  Benazepril Hcl 40 Mg Tabs (Benazepril Hcl) .Marland Kitchen.. 1 By Mouth Daily 3)  Namenda 10 Mg Tabs (Memantine Hcl) .Marland Kitchen.. 1 Two Times A Day 4)  Aricept 10 Mg Tabs (Donepezil Hcl) .Marland Kitchen.. 1 By Mouth Two Times A Day 5)  Simvastatin 80 Mg Tabs (Simvastatin) .Marland Kitchen.. 1 By Mouth Daily 6)  Bisoprolol Fumarate 10 Mg Tabs (Bisoprolol Fumarate) .Marland Kitchen.. 1 By Mouth Daily 7)  Lorazepam 0.5 Mg Tabs (Lorazepam) .... One By Mouth Two Times A Day As Needed For Anxiety 8)  Trazodone Hcl 50 Mg Tabs (Trazodone Hcl) .... As Needed 9)  Zantac 75 75 Mg Tabs (Ranitidine Hcl) .... Daily 10)  Aspirin 81 Mg  Tabs (Aspirin) .... Daily 11)  Multivitamins   Tabs (Multiple Vitamin) .... Daily 12)  Allopurinol 100 Mg Tabs (Allopurinol) .... Take 1 Tablet By Mouth Once A Day 13)  Lexapro 10 Mg Tabs (Escitalopram Oxalate) .... Take 1 Tablet By Mouth Once A Day 14)  Imodium A-D 2 Mg Tabs (Loperamide Hcl) .... As Needed  Allergies (verified): No Known Drug Allergies  Past History:  Past Surgical History: Last updated: 2008/12/07 C-Section Hernia repair s/p parathyroidectomy  Family History: Last updated: Dec 07, 2008 Father deceased with ? etiol. CVA Mother sudden death age 1  Social History: Last updated: 10/15/2010 Lives alone Remote 50pk yr smoking hx Retired Alcohol use-no Drug use-no Regular  exercise-no  Risk Factors: Alcohol Use: 0 (10/15/2010) Exercise: no (10/15/2010)  Risk Factors: Smoking Status: quit (10/15/2010)  Past Medical History: 1. Non-Ischemic CM, EF=25-35% (echo 6/04) 2. CAD (non-obstructive) 3. Hypertension 4. Mild Alzheimer's dementia 5. Benign positional vertigo. 6. A history of hypoparathyroidism (s/p parathyroidectomy). 7. Status post bilateral cataract removal. 8. Degenerative joint disease. 9. Mild dyslipidemia 10. s/p ventilator-dependent respiratory failure/pulmonary     edema/cardiac enzymes show no distinct pattern or elevation (6/04) Gout Renal insufficiency Anxiety  Family History: Reviewed history  from 11/07/2008 and no changes required. Father deceased with ? etiol. CVA Mother sudden death age 65  Social History: Reviewed history from 11/07/2008 and no changes required. Lives alone Remote 50pk yr smoking hx Retired Alcohol use-no Drug use-no Regular exercise-no Drug Use:  no Does Patient Exercise:  no Smoking Status:  quit  Review of Systems  The patient denies anorexia, fever, weight loss, weight gain, chest pain, syncope, dyspnea on exertion, peripheral edema, prolonged cough, headaches, hemoptysis, abdominal pain, hematuria, suspicious skin lesions, transient blindness, difficulty walking, depression, abnormal bleeding, enlarged lymph nodes, and angioedema.   Psych:  Complains of anxiety; denies alternate hallucination ( auditory/visual), depression, easily angered, easily tearful, irritability, mental problems, panic attacks, sense of great danger, suicidal thoughts/plans, thoughts of violence, unusual visions or sounds, and thoughts /plans of harming others.  Physical Exam  General:  alert, well-developed, well-nourished, well-hydrated, appropriate dress, normal appearance, and healthy-appearing.   Head:  normocephalic, atraumatic, no abnormalities observed, and no abnormalities palpated.   Eyes:  vision grossly  intact, pupils equal, pupils round, and pupils reactive to light.   Mouth:  Oral mucosa and oropharynx without lesions or exudates.  Teeth in good repair. Neck:  supple, full ROM, no masses, no thyromegaly, no JVD, normal carotid upstroke, no carotid bruits, no cervical lymphadenopathy, and no neck tenderness.   Lungs:  normal respiratory effort, no intercostal retractions, no accessory muscle use, normal breath sounds, no dullness, no fremitus, no crackles, and no wheezes.   Heart:  normal rate, regular rhythm, no murmur, no gallop, no rub, and no JVD.   Abdomen:  soft, non-tender, normal bowel sounds, no distention, no masses, no guarding, no rigidity, no rebound tenderness, no abdominal hernia, no inguinal hernia, no hepatomegaly, and no splenomegaly.   Msk:  normal ROM, no joint tenderness, no joint swelling, no joint warmth, no redness over joints, no joint deformities, no joint instability, and no crepitation.   Pulses:  pulses normal in all 4 extremities Extremities:  No clubbing or cyanosis. Neurologic:  Alert and oriented x 3. Skin:  turgor normal, color normal, no rashes, no suspicious lesions, no ecchymoses, no petechiae, no purpura, no ulcerations, and no edema.   Cervical Nodes:  no anterior cervical adenopathy and no posterior cervical adenopathy.   Psych:  normally interactive, good eye contact, not anxious appearing, not depressed appearing, flat affect, easily distracted, poor concentration, and memory impairment.     Impression & Recommendations:  Problem # 1:  ANXIETY (ICD-300.00) Assessment Unchanged  Her updated medication list for this problem includes:    Lorazepam 0.5 Mg Tabs (Lorazepam) ..... One by mouth two times a day as needed for anxiety    Trazodone Hcl 50 Mg Tabs (Trazodone hcl) .Marland Kitchen... As needed    Lexapro 10 Mg Tabs (Escitalopram oxalate) .Marland Kitchen... Take 1 tablet by mouth once a day  Discussed medication use and relaxation techniques.   Problem # 2:  RENAL  INSUFFICIENCY (ICD-588.9) Assessment: Unchanged  Orders: Venipuncture (78469) TLB-Lipid Panel (80061-LIPID) TLB-BMP (Basic Metabolic Panel-BMET) (80048-METABOL) TLB-CBC Platelet - w/Differential (85025-CBCD) TLB-Hepatic/Liver Function Pnl (80076-HEPATIC) TLB-TSH (Thyroid Stimulating Hormone) (84443-TSH) TLB-Udip w/ Micro (81001-URINE) TLB-Uric Acid, Blood (84550-URIC)  Problem # 3:  ESSENTIAL HYPERTENSION, BENIGN (ICD-401.1) Assessment: Improved  Her updated medication list for this problem includes:    Furosemide 40 Mg Tabs (Furosemide) .Marland Kitchen... 1 by mouth daily    Benazepril Hcl 40 Mg Tabs (Benazepril hcl) .Marland Kitchen... 1 by mouth daily    Bisoprolol Fumarate 10 Mg Tabs (Bisoprolol fumarate) .Marland KitchenMarland KitchenMarland KitchenMarland Kitchen  1 by mouth daily  Orders: Venipuncture (78295) TLB-Lipid Panel (80061-LIPID) TLB-BMP (Basic Metabolic Panel-BMET) (80048-METABOL) TLB-CBC Platelet - w/Differential (85025-CBCD) TLB-Hepatic/Liver Function Pnl (80076-HEPATIC) TLB-TSH (Thyroid Stimulating Hormone) (84443-TSH) TLB-Udip w/ Micro (81001-URINE) TLB-Uric Acid, Blood (84550-URIC)  BP today: 122/64 Prior BP: 159/62 (05/14/2010)  Labs Reviewed: K+: 4.5 (04/27/2010) Creat: : 2.1 (04/27/2010)   Chol: 117 (11/20/2007)   HDL: 50.3 (11/20/2007)   LDL: 56 (11/20/2007)   TG: 51 (11/20/2007)  Problem # 4:  HYPERLIPIDEMIA-MIXED (ICD-272.4) Assessment: Unchanged  Her updated medication list for this problem includes:    Simvastatin 80 Mg Tabs (Simvastatin) .Marland Kitchen... 1 by mouth daily  Orders: Venipuncture (62130) TLB-Lipid Panel (80061-LIPID) TLB-BMP (Basic Metabolic Panel-BMET) (80048-METABOL) TLB-CBC Platelet - w/Differential (85025-CBCD) TLB-Hepatic/Liver Function Pnl (80076-HEPATIC) TLB-TSH (Thyroid Stimulating Hormone) (84443-TSH) TLB-Udip w/ Micro (81001-URINE) TLB-Uric Acid, Blood (84550-URIC)  Labs Reviewed: SGOT: 23 (04/02/2008)   SGPT: 19 (04/02/2008)   HDL:50.3 (11/20/2007)  LDL:56 (11/20/2007)  Chol:117 (11/20/2007)   Trig:51 (11/20/2007)  Problem # 5:  SYSTOLIC HEART FAILURE, CHRONIC (ICD-428.22) Assessment: Unchanged  Her updated medication list for this problem includes:    Furosemide 40 Mg Tabs (Furosemide) .Marland Kitchen... 1 by mouth daily    Benazepril Hcl 40 Mg Tabs (Benazepril hcl) .Marland Kitchen... 1 by mouth daily    Bisoprolol Fumarate 10 Mg Tabs (Bisoprolol fumarate) .Marland Kitchen... 1 by mouth daily    Aspirin 81 Mg Tabs (Aspirin) .Marland Kitchen... Daily  Orders: Venipuncture (86578) TLB-Lipid Panel (80061-LIPID) TLB-BMP (Basic Metabolic Panel-BMET) (80048-METABOL) TLB-CBC Platelet - w/Differential (85025-CBCD) TLB-Hepatic/Liver Function Pnl (80076-HEPATIC) TLB-TSH (Thyroid Stimulating Hormone) (84443-TSH) TLB-Udip w/ Micro (81001-URINE) TLB-Uric Acid, Blood (84550-URIC)  Complete Medication List: 1)  Furosemide 40 Mg Tabs (Furosemide) .Marland Kitchen.. 1 by mouth daily 2)  Benazepril Hcl 40 Mg Tabs (Benazepril hcl) .Marland Kitchen.. 1 by mouth daily 3)  Namenda 10 Mg Tabs (Memantine hcl) .Marland Kitchen.. 1 two times a day 4)  Aricept 10 Mg Tabs (Donepezil hcl) .Marland Kitchen.. 1 by mouth two times a day 5)  Simvastatin 80 Mg Tabs (Simvastatin) .Marland Kitchen.. 1 by mouth daily 6)  Bisoprolol Fumarate 10 Mg Tabs (Bisoprolol fumarate) .Marland Kitchen.. 1 by mouth daily 7)  Lorazepam 0.5 Mg Tabs (Lorazepam) .... One by mouth two times a day as needed for anxiety 8)  Trazodone Hcl 50 Mg Tabs (Trazodone hcl) .... As needed 9)  Zantac 75 75 Mg Tabs (Ranitidine hcl) .... Daily 10)  Aspirin 81 Mg Tabs (Aspirin) .... Daily 11)  Multivitamins Tabs (Multiple vitamin) .... Daily 12)  Allopurinol 100 Mg Tabs (Allopurinol) .... Take 1 tablet by mouth once a day 13)  Lexapro 10 Mg Tabs (Escitalopram oxalate) .... Take 1 tablet by mouth once a day 14)  Imodium A-d 2 Mg Tabs (Loperamide hcl) .... As needed  Lipid Assessment/Plan:      Based on NCEP/ATP III, the patient's risk factor category is "2 or more risk factors and a calculated 10 year CAD risk of < 20%".  The patient's lipid goals are as follows: Total  cholesterol goal is 200; LDL cholesterol goal is 130; HDL cholesterol goal is 40; Triglyceride goal is 150.     Patient Instructions: 1)  Please schedule a follow-up appointment in 4 months. 2)  Limit your Sodium (Salt) to less than 4 grams a day (slightly less than 1 teaspoon) to prevent fluid retention, swelling, or worsening or symptoms. 3)  Check your Blood Pressure regularly. If it is above 130/80: you should make an appointment. Prescriptions: LORAZEPAM 0.5 MG TABS (LORAZEPAM) One by mouth two times a day as needed  for anxiety  #60 x 3   Entered and Authorized by:   Etta Grandchild MD   Signed by:   Etta Grandchild MD on 10/15/2010   Method used:   Print then Give to Patient   RxID:   [id number]    Orders Added: 1)  Venipuncture [14782] 2)  TLB-Lipid Panel [80061-LIPID] 3)  TLB-BMP (Basic Metabolic Panel-BMET) [80048-METABOL] 4)  TLB-CBC Platelet - w/Differential [85025-CBCD] 5)  TLB-Hepatic/Liver Function Pnl [80076-HEPATIC] 6)  TLB-TSH (Thyroid Stimulating Hormone) [84443-TSH] 7)  TLB-Udip w/ Micro [81001-URINE] 8)  TLB-Uric Acid, Blood [84550-URIC] 9)  Est. Patient Level IV [95621]

## 2010-10-22 NOTE — Letter (Signed)
Summary: Results Follow-up Letter  [facility]  [address] [place], [place] [address]   Phone: [phone]  Fax: [phone]    10/15/2010  [address] [place], [place]  [address]  [place]  Dear Ms. Zumstein,   The following are the results of your recent test(s):  Test     Result     Kidney function   slightly improved! CBC       mild anemia Liver       normal Thyroid     normal Urine       looks contaminated   _________________________________________________________  Please call for an appointment as directed _________________________________________________________ _________________________________________________________ _________________________________________________________  Sincerely,  Sanda Linger MD [facility]

## 2010-10-22 NOTE — Progress Notes (Signed)
  Phone Note Call from Patient   Caller: [name] [name] [phone] Summary of Call: Messg lovm requesting status of Lorazepam refill request..Marland KitchenMarland KitchenAlvy Beal Archie CMA  October 13, 2010 4:38 PM   Follow-up for Phone Call        Caretaker notified and appt set up.Alvy Beal Archie CMA  October 14, 2010 9:27 AM

## 2010-10-22 NOTE — Letter (Signed)
Summary: Lipid Letter  [facility]  [address] [place], [place] [address]   Phone: [phone]  Fax: [phone]    10/15/2010  [name] [name] [address] [place], [place]  [address]  Dear [name] [name]:  We have carefully reviewed your last lipid profile from 10/15/2010 and the results are noted below with a summary of recommendations for lipid management.    Cholesterol:       116     Goal: <200   HDL "good" Cholesterol:   57.84     Goal: >40   LDL "bad" Cholesterol:   40     Goal: <130   Triglycerides:       116.0     Goal: <150        TLC Diet (Therapeutic Lifestyle Change): Saturated Fats & Transfatty acids should be kept < 7% of total calories ***Reduce Saturated Fats Polyunstaurated Fat can be up to 10% of total calories Monounsaturated Fat Fat can be up to 20% of total calories Total Fat should be no greater than 25-35% of total calories Carbohydrates should be 50-60% of total calories Protein should be approximately 15% of total calories Fiber should be at least 20-30 grams a day ***Increased fiber may help lower LDL Total Cholesterol should be < 200mg /day Consider adding plant stanol/sterols to diet (example: Benacol spread) ***A higher intake of unsaturated fat may reduce Triglycerides and Increase HDL    Adjunctive Measures (may lower LIPIDS and reduce risk of Heart Attack) include: Aerobic Exercise (20-30 minutes 3-4 times a week) Limit Alcohol Consumption Weight Reduction Aspirin 75-81 mg a day by mouth (if not allergic or contraindicated) Dietary Fiber 20-30 grams a day by mouth     Current Medications: 1)    Furosemide 40 Mg Tabs (Furosemide) .Marland Kitchen.. 1 by mouth daily 2)    Benazepril Hcl 40 Mg Tabs (Benazepril hcl) .Marland Kitchen.. 1 by mouth daily 3)    Namenda 10 Mg Tabs (Memantine hcl) .Marland Kitchen.. 1 two times a day 4)    Aricept 10 Mg Tabs (Donepezil hcl) .Marland Kitchen.. 1 by mouth two times a day 5)    Simvastatin 80 Mg Tabs (Simvastatin) .Marland Kitchen.. 1 by mouth  daily 6)    Bisoprolol Fumarate 10 Mg Tabs (Bisoprolol fumarate) .Marland Kitchen.. 1 by mouth daily 7)    Lorazepam 0.5 Mg Tabs (Lorazepam) .... One by mouth two times a day as needed for anxiety 8)    Trazodone Hcl 50 Mg Tabs (Trazodone hcl) .... As needed 9)    Zantac 75 75 Mg Tabs (Ranitidine hcl) .... Daily 10)    Aspirin 81 Mg  Tabs (Aspirin) .... Daily 11)    Multivitamins   Tabs (Multiple vitamin) .... Daily 12)    Allopurinol 100 Mg Tabs (Allopurinol) .... Take 1 tablet by mouth once a day 13)    Lexapro 10 Mg Tabs (Escitalopram oxalate) .... Take 1 tablet by mouth once a day 14)    Imodium A-d 2 Mg Tabs (Loperamide hcl) .... As needed  If you have any questions, please call. We appreciate being able to work with you.   Sincerely,    [facility] Etta Grandchild MD

## 2010-11-26 ENCOUNTER — Other Ambulatory Visit: Payer: Self-pay | Admitting: Internal Medicine

## 2010-12-04 ENCOUNTER — Other Ambulatory Visit: Payer: Self-pay | Admitting: *Deleted

## 2010-12-04 MED ORDER — MEMANTINE HCL 10 MG PO TABS: 10.0000 mg | ORAL_TABLET | Freq: Two times a day (BID) | ORAL | Status: DC

## 2010-12-29 NOTE — Assessment & Plan Note (Signed)
[facility]                            CARDIOLOGY OFFICE NOTE   NAME:[name] [name]                        MRN:          [id number]  DATE:04/04/2007                            DOB:          09/18/1919    PRIMARY CARE PHYSICIAN:  Dr. Bradd Canary.   REASON FOR PRESENTATION:  Evaluate patient with cardiomyopathy and  hypertension.   HISTORY OF PRESENT ILLNESS:  Patient returns for 6 month followup. She  is 77 years old. She still lives by herself despite what her daughter  describes as rapidly progressive dementia. She is not complaining of  anything in particular. She does not do much. She gets around in the  house. She goes out a little bit. She does not report any dyspnea, PND,  or orthopnea. She does not describe any chest discomfort, neck, or arm  discomfort. She has no palpitations, pre syncope, or syncope. Her  daughter has kept a blood pressure diary. She typically has pressures in  the 150s and 160s systolic and rarely less than 132.   PAST MEDICAL HISTORY:  Non ischemic cardiomyopathy (ejection fraction  45% to 50% on echo in 2006), non obstructive coronary artery disease,  Alzheimer's type dementia, benign positional vertigo, gout,  hypoparathyroidism, parathyroidectomy, degenerative joint disease,  dyslipidemia, hypertension, bilateral cataract removal.    1. Furosemide 40 mg daily.  2. Aricept 10 mg at bedtime.  3. Potassium 20 mEq daily.  4. Multivitamin.  5. Aspirin 81 mg daily.  6. Benazepril 20 mg daily.  7. Zantac 75 mg daily.  8. Bisoprolol 10 mg daily.  9. Namenda 10 mg b.i.d.  10.Lipitor 40 mg daily.   REVIEW OF SYSTEMS:  As stated in the HPI and otherwise negative for  systems.   PHYSICAL EXAMINATION:  The patient is in no distress. She is pleasant  but confused. Blood pressure 159/68, heart rate 68 and regular, weight  181 pounds, body mass index 34.  HEENT: Eyelids unremarkable, pupils equal, round, and reactive to  light,  fundi not visualized. Oral mucosa unremarkable.  NECK:  No jugular venous distension, wave form within normal limits.  Carotid upstroke brisk and symmetric. No bruits. No thyromegaly.  LYMPHATICS:  No cervical, axillary, inguinal adenopathy.  LUNGS: Clear to auscultation bilaterally.  BACK: No costovertebral angle tenderness.  CHEST: Unremarkable, PMI not displaced or sustained. S1 and S3 within  normal limits. No S3. No S4, clicks, rubs, or murmurs.  ABDOMEN: Flat, positive bowel sounds normal to frequency and pitch. No  bruits, rebound, guarding. No midline pulsatile mass. No hepatomegaly,  no splenomegaly.  SKIN: No rashes. No nodules.  EXTREMITIES: 2 + pulses, no edema.   EKG: Sinus rhythm, rate 67, left bundle branch block, left axis  deviation.   ASSESSMENT/PLAN:  1. Hypertension, blood pressure is still elevated. At this point I am      going to add spironolactone for management of this as well as her      cardiomyopathy. I discussed the risks and benefits including      hyperkalemia. She is going stop her potassium. She is going  to get      a BMET in 1 week and then again in 1 month. She should get a BMET 3      months from this. I did suggest 3 to 4 times a year. I will see her      again in 6 months to follow up on this and her hypertension.  2. Cardiomyopathy, she is not having any symptoms related to this. She      will continue to have medication otherwise as listed.  3. Follow up, I will see the patient back in 6 months and then      probably as needed thereafter.     Rollene Rotunda, MD, [facility]  Electronically Signed    JH/MedQ  DD: 04/04/2007  DT: 04/05/2007  Job #: [id number]   cc:   Quita Skye. Artis Flock, M.D.

## 2010-12-29 NOTE — Assessment & Plan Note (Signed)
[facility]                            CARDIOLOGY OFFICE NOTE   NAME:[name] [name], [name] [name]                        MRN:          [id number]  DATE:04/02/2008                            DOB:          1920/03/02    PRIMARY CARE PHYSICIAN:  Quita Skye. Artis Flock, M.D.   REASON FOR PRESENTATION:  Evaluate patient with cardiomyopathy and  hypertension.   HISTORY OF PRESENT ILLNESS:  The patient is a pleasant 77 year old  African American female with cardiomyopathy, nonischemic.  She has done  very well since I last saw her.  She has had no new problems.  She does  light housekeeping.  This includes going up and down the basement stairs  to do the laundry.  With this level of activity, she denies any chest  pressure, neck discomfort, arm discomfort, activity-induced nausea,  vomiting, excessive diaphoresis.  She had no palpitation, presyncope, or  syncope.   PAST MEDICAL HISTORY:  Nonischemic myopathy (EF 45-50% on echo in 2006),  nonobstructive coronary artery disease, Alzheimer type dementia, benign  positional vertigo, gout, hypoparathyroidism, parathyroidectomy,  degenerative joint disease, dyslipidemia, hypertension, bilateral  cataract removed.   ALLERGIES/INTOLERANCES:  None.   MEDICATIONS:  1. Bisoprolol 10 mg daily.  2. Furosemide 40 mg daily.  3. Multivitamin.  4. Zantac.  5. Aspirin 81 mg daily.  6. Namenda 20 mg daily.  7. Aricept 10 mg daily.  8. Spironolactone 25 mg daily.  9. Benazepril 40 mg daily.  10.Simvastatin 80 mg at bedtime.   REVIEW OF SYSTEMS:  As stated in the HPI and otherwise negative for  other systems.   PHYSICAL EXAMINATION:  GENERAL:  The patient is in no distress.  VITAL SIGNS:  Blood pressure 144/64, heart rate 55 and regular, weight  178 pounds.  NECK:  No jugular venous distention at 45 degrees, carotid upstroke  brisk and symmetrical.  No bruits, no thyromegaly.  LYMPHATICS:  No adenopathy.  LUNGS:  Clear to  auscultation bilaterally.  BACK:  No costovertebral mass.  CHEST:  Unremarkable.  HEART:  PMI not displaced or sustained, S1 and S2 within normal limits.  No S3, no S4, no clicks, no rubs, no murmurs.  ABDOMEN:  Flat, positive  bowel sounds.  Normal in frequency and pitch, no bruits, no rebound, no  guarding, no midline pulsatile mass, no organomegaly.  SKIN:  No rashes, no nodules.  EXTREMITIES:  2+ pulse, no edema.   EKG sinus bradycardia, rate 55, left bundle-branch block, left axis  deviation, no change from previous.   ASSESSMENT AND PLAN:  1. Hypertension.  Blood pressure is better controlled.  She is on the      medicines as listed.  At this point, I am not making any changes.      Her daughter has checked it at home, but does not recall the      readings.  She does not think it is particularly elevated.  She      needs to get a BMET about 3 times a year and I have told her      daughter this.  We will check her today.  We will also check liver      profile since she is on a statin.  2. Renal insufficiency.  This followed by Dr. Artis Flock.  3. Dyslipidemia.  She had an excellent lipid profile earlier this      year.  I would not check this again, but again we will check the      liver.  4. Cardiomyopathy.  She has a mildly reduced ejection fraction and no      symptoms.  She will remain on medicines as listed.  5. Followup.  The patient would like to be seen in this clinic every 6      months and I will oblige.     Rollene Rotunda, MD, [facility]  Electronically Signed    JH/MedQ  DD: 04/02/2008  DT: 04/02/2008  Job #: [id number]   cc:   Quita Skye. Artis Flock, M.D.

## 2010-12-29 NOTE — Assessment & Plan Note (Signed)
[facility]                            CARDIOLOGY OFFICE NOTE   NAME:[name] [name], [name] [name]                        MRN:          [id number]  DATE:09/25/2007                            DOB:          30-Jul-1920    PRIMARY CARE PHYSICIAN:  Dr. Bradd Canary.   REASON FOR VISIT:  Evaluate patient with cardiomyopathy and  hypertension.   HISTORY OF PRESENT ILLNESS:  The patient is a pleasant, 77 year old who  presents for follow-up of her known nonischemic cardiomyopathy and  hypertension.  She has been getting along well without new  cardiovascular complaints.  She is and having no PND or orthopnea.  She  has been having no chest discomfort, neck or arm discomfort..  She is  not describing any palpitations, presyncope or syncope.  She is still  living independently with close follow-up from her daughter.  She does  have some severe dementia.   PAST MEDICAL HISTORY:  Nonischemic cardiomyopathy (EF 45-50% echo in  2006).  Nonobstructive coronary artery disease, Alzheimer's type  dementia, benign positional vertigo, gout, hypoparathyroidism,  parathyroidectomy, degenerative joint disease, dyslipidemia,  hypertension, bilateral cataract removal.   MEDICATIONS:  1. Lipitor 40 mg daily.  2. Bisoprolol 10 mg daily.  3. Furosemide 40 mg daily.  4. Benazepril 20 mg daily.  5. Multivitamins.  6. Zantac 75 mg.  7. Aspirin 81 mg daily.  8. Namenda 10 mg daily.  9. Aricept 10 mg daily.  10.Spironolactone 25 mg daily.   REVIEW OF SYSTEMS:  As stated in the HPI and otherwise negative for  other systems.   PHYSICAL EXAMINATION:  The patient is in no distress.  Blood pressure 192/73, heart rate 56 and regular, weight 182 pounds,  body mass index 34.  HEENT:  Eyelids unremarkable. Pupils equal, round, and reactive to  light. Fundi not visualized, oral mucosa unremarkable.  NECK:  No jugular distention at 45 degrees, carotid upstroke brisk and  symmetric.  No bruits,  no thyromegaly.  LYMPHATICS:  No adenopathy.  LUNGS:  Clear to auscultation bilaterally.  BACK:  No costovertebral angle tenderness.  CHEST:  Unremarkable.  HEART:  PMI not displaced or sustained, S1 and S2 within normal limits,  no S3, no S4, no clicks, no rubs, no murmurs.  ABDOMEN:  Flat, positive bowel sounds normal in frequency and pitch, no  bruits, no rebound, no guarding, no midline pulsatile mass, no  organomegaly.  SKIN:  No rash, no nodules.  EXTREMITIES:  2+ pulses, no edema.   EKG sinus rhythm, left bundle branch block, left axis deviation,  borderline first-degree AV block.   ASSESSMENT/PLAN:  1. Hypertension. Her blood pressure still remains elevated.  At this      point, I am going to increase her benazepril to 40 mg daily.  She      may need another agent such as amlodipine added.  The next stop      however might be to go up on her spironolactone.  We will need to      watch her renal function carefully with the ACE inhibitor.  2. Renal  insufficiency.  The patient has some mild renal      insufficiency.  This was followed recently.  The creatinine was      1.4.  3. Dyslipidemia. Cost is an issue here.  She had an excellent lipid      profile on Lipitor 40.  However, I am going to switch her to 80 mg      of Lipitor.  I talked over with her daughter the potential for more      drug interaction or side effects though I think this is low and it      is reasonable to try the simvastatin 80.  I will give her a      prescription for this and then check a lipid profile and liver      enzyme in about 8 weeks.  Her daughter will let me know if she has      any complaints that may be related to this.  4. Nausea, cardiomyopathy. The patient is having no overt symptoms.      No further cardiovascular testing is suggested.  She seems      euvolemic at this point and  will continue the medications as      listed.  5. Follow-up. I will see her back in about 6 months or sooner  if      needed.     Rollene Rotunda, MD, [facility]  Electronically Signed    JH/MedQ  DD: 09/25/2007  DT: 09/26/2007  Job #: [id number]   cc:   Quita Skye. Artis Flock, M.D.

## 2010-12-29 NOTE — Assessment & Plan Note (Signed)
[facility]                            CARDIOLOGY OFFICE NOTE   NAME:[name], [name]                        MRN:          [id number]  DATE:10/07/2008                            DOB:          [birth year]    PRIMARY CARE PHYSICIAN:  None.   REASON FOR PRESENTATION:  Evaluate the patient with cardiomyopathy and  hypertension.   HISTORY OF PRESENT ILLNESS:  The patient presents for followup.  She is  77 years old.  Since I last saw her, she has moved in with her daughter  who takes care of her with her severe dementia.  Her daughter is not  working out of the home, which makes this doable.  The patient has had  no new shortness of breath.  She denies any palpitations, presyncope, or  syncope.  She does not have any PND.  She denies any chest discomfort.   PAST MEDICAL HISTORY:  Nonischemic cardiomyopathy (EF 45-50% on echo in  2006), nonobstructive coronary artery disease, Alzheimer-type dementia,  hypertension, benign prostatic hypertrophy, hypoparathyroidism,  parathyroidectomy, degenerative joint disease, dyslipidemia, bilateral  cataract removal, and renal insufficiency.   ALLERGIES:  None.   MEDICATIONS:  1. Bisoprolol 10 mg daily.  2. Furosemide 40 mg daily.  3. Multivitamin.  4. Zantac.  5. Aspirin.  6. Namenda 20 mg daily.  7. Aricept 10 mg daily.  8. Spironolactone 25 mg daily.  9. Benazepril 40 mg daily.  10.Simvastatin 80 mg at bedtime.   REVIEW OF SYSTEMS:  As stated in the HPI and otherwise negative for  other systems.   PHYSICAL EXAMINATION:  GENERAL:  The patient is pleasant and in no  distress.  She is pleasantly demented.  VITAL SIGNS:  Blood pressure 158/60, heart rate 58 and regular, and  weight 168 pounds.  HEENT:  Eyelids unremarkable; pupils are equal, round, and reactive to  light; fundi not visualized; oral mucosa unremarkable.  NECK:  No jugular venous distention at 45 degrees, carotid upstroke  brisk and symmetric,  no bruits, no thyromegaly.  LYMPHATICS:  No cervical, axillary, or inguinal adenopathy.  LUNGS:  Clear to auscultation bilaterally.  BACK:  No costovertebral angle tenderness.  CHEST:  Unremarkable.  HEART:  PMI not displaced or sustained; S1 and S2 within normal limits,  no S3, no S4; no clicks, no rubs, no murmurs.  ABDOMEN:  Flat; positive bowel sounds, normal in frequency and pitch; no  bruits, no rebound, no guarding, no midline pulsatile mass; no  hepatomegaly, no splenomegaly.  SKIN:  No rashes, no nodules.  EXTREMITIES:  Pulses 2+ throughout, no edema, no cyanosis, no clubbing.  NEURO:  Oriented to person, place, and time; cranial nerves II through  XII grossly intact; motor grossly intact.   EKG, sinus rhythm, left bundle branch block, left axis deviation, no  change from previous.   ASSESSMENT AND PLAN:  1. Hypertension.  Her blood pressure is slightly elevated today.      However, her daughter has not been taking it at her home.  I do not      know if this is usual.  I would be afraid of hypertension in this      patient as a consequence of more medications.  I am going to have      her daughter keep blood pressure diary and return that to me in a      couple of months to see if I need to further dose adjust her meds      for better blood pressure control with a target of 140/90.  2. Renal insufficiency.  I had discussed this with her daughter.  I      will check her BMET today.  I will adjust meds as needed.  3. Cardiomyopathy.  The patient has a mildly reduced ejection      fraction, but seems to be euvolemic.  She will remain on the meds      as listed, pending the labs.  4. Followup.  I will see her again in 6 months or sooner as needed.     Rollene Rotunda, MD, [facility]  Electronically Signed    JH/MedQ  DD: 10/07/2008  DT: 10/07/2008  Job #: [id number]

## 2011-01-01 NOTE — Discharge Summary (Signed)
NAME:  [name] [name], [name] [name] NO.:  [id number]   MEDICAL RECORD NO.:  [id number]                   PATIENT TYPE:  INP   LOCATION:  3727                                 FACILITY:  [facility]   PHYSICIAN:  Pricilla Riffle, M.D. [facility]             DATE OF BIRTH:  August 11, 1920   DATE OF ADMISSION:  06/12/2002  DATE OF DISCHARGE:  06/14/2002                           DISCHARGE SUMMARY - REFERRING   ADMITTING PHYSICIAN:  Learta Codding, M.D.   DISCHARGING PHYSICIAN:  Pricilla Riffle, M.D.   HISTORY OF PRESENT ILLNESS:  The patient is an 77 year old black female.  Upon awakening this morning, she developed dizziness when she was trying to  walk to the bathroom.  She stated that things felt like they were spinning  around her, but she denied any presyncope.  It was noted that she was  diagnosed on May 06, 2002, with severe vertigo, which was benign  positional in nature with small vessel cerebrovascular disease.  Carotid  Dopplers at that time were negative and she was evaluated by neurology.  The  patient presented today reporting mid substernal chest discomfort associated  with dizziness.  However, at the time of evaluation she denied any symptoms.  She denied any previous chest discomfort on exertion over the preceding  days.  She does have a history of Alzheimer's, hypertension, and  hyperparathyroidism.  Known EF was 55-65% by an echocardiogram.   LABORATORY DATA:  Fasting lipids showed a total cholesterol of 237,  triglycerides 154, HDL 65, and LDL 141.  C-reactive protein was low at 0.3.  BNP was within normal limits at 89.8.  D-dimer was slightly elevated at  0.89.  The TSH was slightly elevated at 5.815, however, free T4 was slightly  low at 0.75 and free T3 was 2.7.  CKs and troponins were negative for  myocardial infarction.  The admission sodium was 138, potassium 3.4, BUN 14,  and creatinine 0.9.  Normal LFTs.  PT 13.5, PTT 31.  Hemoglobin 13.7,  hematocrit  40.1, normal indices, platelets 212, WBC 6.4.  Head CT showed  stable diffuse cerebral and cerebellar atrophy, small vessel white matter  ischemic changes in both cerebral hemispheres with mild progression, and no  acute abnormality.  The chest x-ray showed cardiomegaly without acute  processes.  Echocardiogram showed EF of 45-50% with mild diffuse left  hypokinesis.  Dopplers were consistent with abnormal left ventricular  relaxation and mild AI.  The EKG showed normal sinus rhythm, left bundle  branch block, and left axis deviation.   HOSPITAL COURSE:  The patient was admitted to the unit 3700.  Overnight she  did not have any further chest discomfort.  Enzymes and EKGs were negative  for myocardial infarction.  Initially she was planned for a Cardiolite,  however, while she was under the camera for her resting images she became  claustrophobic and refused any medication for sedation to  allow to proceed  with imaging.  An echocardiogram was obtained as previously described.  Learta Codding, M.D., notes that she could have an outpatient dobutamine 2-D  echocardiogram.  On June 14, 2002, after review, Pricilla Riffle, M.D.,  felt that she could be discharged home.   DISCHARGE DIAGNOSES:  1. Atypical chest discomfort.  Unable to complete Cardiolite secondary to     claustrophobia.  2. Hypertension.  3. Dizziness with a history of vertigo.  4. Hyperlipidemia.  5. Abnormal thyroid function.   DISPOSITION:  The patient will be discharged home.   DISCHARGE MEDICATIONS:  Pricilla Riffle, M.D., is giving her a new  prescription for Imdur 30 mg q.d. which she is to start on June 15, 2002.  She was asked to continue her other medications, which include Corgard 40 mg  q.h.s., Aricept 10 mg q.d., Zantac 75 mg q.d., coated aspirin 325 mg q.d.,  multivitamins q.d., and Maxzide 37.5/25 mg q.d.   DIET:  She is asked to maintain a low-salt, low-fat, low-cholesterol diet.   FOLLOW-UP:  If she  had any problems, she was asked to call our office  immediately.  She will have a dobutamine echocardiogram on Monday, June 18, 2002, at 3 p.m.  She will see Pricilla Riffle, M.D., on Monday, June 18, 2002, at 4 p.m. to follow up on her echocardiogram.  To arrange a follow-up  appointment with Duffy Rhody C. Andrey Campanile, M.D., after her evaluation is completed  by Dr. Tenny Craw.  In regards to her hyperlipidemia, she was advised dietary  management for future follow-up.  We will ask Dr. Andrey Campanile to comment on her  abnormal thyroid studies and make any further recommendations.     Joellyn Rued, P.A. [facility]                    Pricilla Riffle, M.D. [facility]    EW/MEDQ  D:  06/14/2002  T:  06/14/2002  Job:  [id number]   cc:   Pricilla Riffle, M.D. [facility] C. Andrey Campanile, M.D.  [address]  [place]  [place] [address]  Fax: [phone]

## 2011-01-01 NOTE — Cardiovascular Report (Signed)
NAME:  [name] [name], [name] [name]                         ACCOUNT NO.:  [id number]   MEDICAL RECORD NO.:  [id number]                   PATIENT TYPE:  INP   LOCATION:  4710                                 FACILITY:  [facility]   PHYSICIAN:  Arturo Morton. Riley Kill, M.D.             DATE OF BIRTH:  03-01-1920   DATE OF PROCEDURE:  02/07/2003  DATE OF DISCHARGE:  02/12/2003                              CARDIAC CATHETERIZATION   INDICATIONS:  The patient is a delightful 77 year old lady who presented  with what appeared to be pulmonary edema and severe congestive findings and  was intubated.  She had abnormal enzymes and reduced overall LV function.  She was thought to be at high risk for coronary events and based on her  initial presentation it was felt that it would be best to proceed with  cardiac catheterization.   PROCEDURE:  1. Right and left heart catheterization.  2. Selective coronary arteriography.  3. Selective left ventriculography.   DESCRIPTION OF PROCEDURE:  The patient was brought to the catheterization  laboratory and prepped and draped in the usual fashion.  Through an anterior  puncture the right femoral vein was entered.  An 8-French sheath was placed.  Right heart catheterization was performed using a Swan-Ganz catheter.  Left  heart catheterization was performed using a 6-French sheath then standard  Judkins catheters.  The right heart catheterization was performed after the  left heart catheterization, largely because of the difficulty with the  patient lying on the table.  It was felt that the left heart information was  more critical to her current treatment plan.  She was subsequently taken to  the holding area in satisfactory clinical condition.  There were no  complications.  The patient tolerated the procedure well.   HEMODYNAMIC DATA:  1. Central aorta 146/74 mean 102.  2. Left ventricle 181/6.  3. No significant gradient on pullback across the aortic valve.  4. Right  atrium mean 10.  5. Right ventricle 25/4.  6. Pulmonary artery 25/17.  7. Mean capillary wedge pressure was 12 at end expiration.  8. Arterial saturation 95%.  9. Pulmonary artery saturation 59%.  10.      Fick cardiac output 2.8 L/minute, cardiac index 1.4 L/minute sq m.  11.      Thermodilution cardiac output 2.5 L/minute, 1.3 L/minute sq m.   ANGIOGRAPHIC DATA:  1. Overall systolic function appeared to be well preserved.  There was a     fair amount of ectopy but overall LV systolic function appeared     preserved.  2. The left main coronary artery demonstrates some luminal irregularities     with perhaps 20% narrowing in the mid vessel.  It certainly did not     appear to be high grade.  There was also a major diagonal branch with     likewise some mild luminal irregularities, but no high grade areas of  obstruction.  3. The circumflex proper provided a major large marginal branch and then two     smaller marginal branches.  The circumflex and its branches appeared to     be free of significant disease.  No critical areas of obstruction were     identified.  4. The right coronary artery was a large, fairly dominant vessel that was     very large in caliber.  There is probably about 30-40% eccentric plaquing     near the junction of the proximal and mid vessel near the right coronary.     The distal right coronary also has a fair amount of luminal     irregularities, but again, there did not appear to be critical narrowing.     The vessels as with the previous vessels appeared to be moderately     ectatic.   CONCLUSIONS:  1. Preserved overall systolic function.  2. Large somewhat ectatic coronary arteries that are hypertrophied,     compatible with probable left ventricular hypertrophy.   DISPOSITION:  The patient presented with acute decompensation.  She has not  been on a strict diet and she has had some gradual progressive change in  mental status.  The coronaries do not  suggest of high grade obstruction.  Overall, the LV is hyperdynamic.  The findings may be related to  hypertension.  Given her current situation we will need to try to  aggressively manage her.  She will be followed up by Duffy Rhody C. Andrey Campanile, M.D.  and Arturo Morton. Riley Kill, M.D. in the office.                                               Arturo Morton. Riley Kill, M.D.    TDS/MEDQ  D:  02/15/2003  T:  02/17/2003  Job:  [id number]  CV Lab   cc:   CV Lab

## 2011-01-01 NOTE — Discharge Summary (Signed)
NAME:  [name] [name], [name] [name]                         ACCOUNT NO.:  [id number]   MEDICAL RECORD NO.:  [id number]                   PATIENT TYPE:  INP   LOCATION:  4710                                 FACILITY:  [facility]   PHYSICIAN:  Arturo Morton. Riley Kill, M.D.             DATE OF BIRTH:  01-Oct-1919   DATE OF ADMISSION:  02/02/2003  DATE OF DISCHARGE:  02/12/2003                           DISCHARGE SUMMARY - REFERRING   PRIMARY CARE PHYSICIAN:  Duffy Rhody C. Andrey Campanile, M.D.   DISCHARGE DIAGNOSES:  1. On admission ventilator-dependent respiratory failure/pulmonary     edema/cardiac enzymes show no distinct pattern or elevation.  2. Nonischemic cardiomyopathy by echocardiogram which was done June 21st,     ejection fraction estimated at 25-35%.  3. Noncritical coronary artery disease by left heart catheterization February 07, 2003.  This study showed a proximal 30-40% stenosis in the right     coronary artery but otherwise the left circumflex and the left anterior     descending are normal.  A CT of the head June 19th was negative for acute     infarct, a vascular lesion, hemorrhage, mass, or intracranial injury.     There was atrophy and diffuse small vessel disease.   SECONDARY DIAGNOSES:  1. Hypertension.  2. Mild Alzheimer's dementia.  3. Benign positional vertigo.  4. A history of hypoparathyroidism status post parathyroidectomy.  5. Status post bilateral cataract removal.  6. Degenerative joint disease.  7. Mild dyslipidemia.   PROCEDURES:  1. CT of the head February 02, 2003.  This study showed no evidence of     hemorrhage, mass effect, vascular lesion, or acute infarct.  There was     atrophy and diffuse small vessel disease.  2. Echocardiogram February 04, 2003.  This study showed that the overall left     ventricular systolic function was moderately to markedly decreased with a     left ventricular ejection fraction at 25-35%.  There was moderate     hypokinesis of the apex and anterior wall  and severe hypokinesis to     akinesis of the septal wall.  There is a pattern of mild concentric     hypertrophy and left ventricular wall thickness.  There was moderate     dyssynergic motion of the interventricular septum consistent with     conduction abnormality.  There was mild aortic valvular regurgitation.     There was mild fibrocalcific change of the aortic root with mild     ascending aortic dilatation.  There was mild mitral annular     calcification.  The pulmonic valve appeared normal.  The tricuspid valve     structure was normal.  Right atrial size normal.  Left atrial size at the     upper limits of normal.  3. A left heart catheterization February 07, 2003.  This study showed     noncritical coronary artery  disease with a proximal 30-40% stenosis in     the right coronary artery.  Otherwise normal.   DISCHARGE DISPOSITION:  [name] is ready for discharge February 12, 2003.  This is hospital day #11 after her admission and pulmonary edema  requiring emergent intubation by emergency medical services en route to  [facility] Emergency Room.  During this hospitalization,  she has lost approximately 6 pounds, her pulmonary function has dramatically  improved although at the time of discharge she is still mildly short of  breath with exertion but the patient says that this has been her baseline  pulmonary status.  Lungs on examination are relatively clear without rhonchi  or crackles.  Latest chest x-ray was taken June 27th.  This study showed  cardiomegaly and prominence of the interstitial markings but no evidence of  pulmonary consolidation or effusion.  The thoracic aorta is ectatic.  She is  ambulating independently.  She has full bowel function.  She has no evidence  of peripheral edema.  Her mental status is at baseline.  She has had no  electrocardiographic changes this hospitalization.   DISCHARGE MEDICATIONS:  1. Lasix 40 mg daily.  2.  Metoprolol 50 mg 1/2 tablet in the morning and 1/2 tablet in the evening.  3. Protonix 40 mg daily.  4 . K-Dur 20 mEq daily.  1. Lotensin 10 mg daily.  2. Her Aricept as previously directed.   ACTIVITY:  She is asked not to lift or engage in strenuous activity for the  next two days.   DIET:  Her discharge diet is low sodium, low cholesterol diet.   DISCHARGE INSTRUCTIONS:  She may shower on discharge today.  If her  catheterization site in the right groin becomes swollen, tender, or starts  to drain, she is to call the office of [facility] at [phone].  She  is also asked to weigh herself daily and to write this down in a log and to  bring her weights and medicines to all appointments.  She has an office  visit with Dr. Riley Kill July 15th at noon.   BRIEF HISTORY:  Ms. [name] [name] is an 77 year old African-American female  who presented to the emergency department at [facility] on the  morning of February 02, 2003.  She was in respiratory distress and required  intubation en route.  The patient awoke her daughter about 3 o'clock in the  morning of June 19th stating that she needed help.  She sounded as if she  were gurgling.  EMS was called and the patient was intubated as referred to  above.  The patient was not having any chest pain and the patient is lightly  sedated on the ventilator.   HOSPITAL COURSE:  After arriving on the morning of June 19th, intubated by  Emergency Medical Services, Ms. Vinton was seen by the pulmonary critical  care specialists, cardiac enzymes were obtained as well as a 2-D  echocardiogram.  After initial diuresis, the patient was extubated on  hospital day #2.  She did have elevated serum blood glucose which  represented a transient effect secondary to stress.  Her serum glucose had  not been elevated through the remainder of this hospitalization.  Cardiac enzymes did not show any pattern of increase over three serial studies.  She  was  seen on June 21st by Arturo Morton. Riley Kill, M.D. who ordered a 2-D  echocardiogram.  This study has been dictated above  but showed left  ventricular ejection fraction markedly decreased with the range being 25-  35%.  The patient was early started on a beta-blocker 25 mg b.i.d. and  scheduled for a left heart catheterization.  This study was done on June  24th and showed a nonobstructive coronary artery disease with a 30-40%  proximal stenosis in the right coronary artery only.  The plan was to  continue care for Ms. Sinn with medical management only.  In particular,  she would benefit from addition of ACE-I inhibitor and increase of Lopressor  if tolerated.  She was also seen this hospitalization by a diabetes  coordinator who noted that her hemoglobin A1C was 6.4, high end of normal,  and recommending proper diet for control of her hyperglycemia.  She is also  seen by Case Management Personnel who recommended PT/OT consult.  There were  no postdischarge needs identified for support at home either with PT or OT.  Ms. Sapphira Harjo goes home on hospital day #11, June 29th, with the  medications as dictated above.  Followup with Dr. Riley Kill on July 15th.   LABORATORY DATA THIS ADMISSION:  Hemoglobin A1C 6.4, TSH was 3.497.  Respiratory culture was normal oropharyngeal flora.  Blood cultures were  negative.  A BNP on the morning of June 19th was 127, on the evening of June  19th was 275.  Troponin studies serial were 0.16, 0.10, and 0.07.  Her  creatinine has been in the  range of 1 to 1.2 throughout this hospitalization.  Her potassium has been  tightly controlled throughout the period of her diuresis for pulmonary  edema.  She has not been anemic.  Hemoglobin on admission June 19th was 15,  hematocrit was 43.  Electrocardiogram shows sinus rhythm with left axis  deviation and left bundle branch block.     Maple Mirza, P.A.                    Arturo Morton. Riley Kill, M.D.    GM/MEDQ  D:   02/12/2003  T:  02/12/2003  Job:  [id number]   cc:   Vale Haven. Andrey Campanile, M.D.  [address]  [place]  [place] [address]  Fax: [phone]    cc:   Vale Haven. Andrey Campanile, M.D.  [address]  [place]  [place] [address]  Fax: [phone]

## 2011-01-01 NOTE — Assessment & Plan Note (Signed)
[facility]                            CARDIOLOGY OFFICE NOTE   NAME:Rahming, Lasheika                        MRN:          [id number]  DATE:09/23/2006                            DOB:          Feb 13, 1920    PRIMARY:  Quita Skye. Artis Flock, M.D.   REASON FOR PRESENTATION:  Evaluate patient with cardiomyopathy and  hypertension.   HISTORY OF PRESENT ILLNESS:  The patient is 77 years old.  She lives by  herself.  She has a very attentive daughter.  The patient has some  significant dementia but still manages fairly well per the daughter.  Today, however, she forgot to take her morning medicines.  Her blood  pressure seems to be elevated.  They think that otherwise it is  controlled but they do not get it checked routinely outside of doctors'  offices.   The patient denies any symptoms.  She does not have any shortness of  breath and denies any PND or orthopnea.  She has not had any  palpitations, presyncope or syncope.  She has had some back pain.  This  has been under the lower rib cage or perhaps upper left flank.  This has  been in the last several days.  She thinks her urine has been brown but  she does not describe any dysuria or pyuria.  She has not had any chest  pressure, neck discomfort or arm discomfort.   PAST MEDICAL HISTORY:  1. Nonischemic cardiomyopathy (EF approximately 45-50% on      echocardiogram in 2006).  2. Nonobstructive coronary disease.  3. Alzheimer's type dementia.  4. Benign positional vertigo.  5. Gout.  6. Hypoparathyroidism.  7. Parathyroidectomy.  8. Degenerative joint disease.  9. Dyslipidemia.  10.Hypertension.  11.Bilateral cataract removal.   REVIEW OF SYSTEMS:  As stated in the HPI and otherwise negative for  other systems.   PHYSICAL EXAMINATION:  GENERAL:  The patient is in no distress.  VITAL SIGNS:  Blood pressure 196/88, heart rate 65 and regular, weight  180 pounds.  HEENT:  Eyelids unremarkable.  Pupils  equal, round, and react to light.  Fundi not visualized.  Oral mucosa unremarkable.  NECK:  No jugular venous distention at 45 degrees.  Carotid upstroke  brisk and symmetric.  No bruits, no thyromegaly.  LYMPHATICS:  No cervical, axillary or inguinal adenopathy.  LUNGS:  Clear to auscultation bilaterally.  BACK:  No costovertebral angle tenderness.  CHEST:  Unremarkable.  HEART:  PMI not displaced or sustained.  S1 and S2 within normal limits.  No S3, positive S4.  No clicks, no rubs, no murmurs.  ABDOMEN:  Flat, positive bowel sounds normal in frequency and pitch.  No  bruits, no rebound, no guarding.  No midline pulsatile mass.  No  hepatomegaly, no splenomegaly.  SKIN:  No rashes, no nodules.  EXTREMITIES:  Show 2+ pulses throughout.  No edema, no cyanosis, no  clubbing.  NEUROLOGIC:  Oriented to person, place and time.  Cranial nerves II-XII  grossly intact.  Motor grossly intact.   EKG:  Sinus rhythm, rate 65, left axis  deviation, left anterior  fascicular block, first degree AV block.   ASSESSMENT AND PLAN:  1. Hypertension.  Blood pressure is very elevated today.  However, she      forgot to take her medicines despite a sophisticated Alzheimer's      medication automated distribution system at home.  Her daughter is      going to buy a blood pressure cuff.  She is going to make sure that      when she takes her medicine her blood pressure is in the 130/80      range at least.  This is a necessary management for her mixed      picture of systolic and diastolic heart failure.  She does have      salt restriction and fluid restriction.  2. Cardiomyopathy, as above.  3. Back pain.  The patient has had some back pain and I will take the      liberty of ordering a urinalysis.  4. Followup.  I will see the patient back in about 6 months, or sooner      if she has any problems.     Rollene Rotunda, MD, [facility]  Electronically Signed    JH/MedQ  DD: 09/23/2006  DT: 09/23/2006   Job #: [id number]   cc:   Quita Skye. Artis Flock, M.D.

## 2011-01-01 NOTE — Consult Note (Signed)
[place]  Patient:    [name] [name], [name] [name]                        MRN: [id number] Proc. Date: 05/05/00 Adm. Date:  [id number] Attending:  Drema Halon                          Consultation Report  DATE OF BIRTH:  01-09-1920.  REFERRING PHYSICIAN:  Dr. Andrey Campanile.  FOR REASON FOR EVALUATION:  Dizziness.  HISTORY OF PRESENT ILLNESS:  This is the initial inpatient consultation and evaluation on this 77 year old black female with a past medical history of hypertension, coronary artery disease, and possible dementia, who woke from sleep about 4 or 5 oclock this morning and immediately upon standing felt dizzy.  She described this as a spinning sensation and feeling like she was going to pass out.  This worsened on attempting to stand, and she got back down and crawled to the bathroom and back.  Later she called her daughter and then had to crawl to the front door to let her in.  She was subsequently taken to the emergency room, she she continues to report severe dizziness upon standing up from lying down.  She has only minimal symptoms when lying flat and turning her head.  She has reportedly told other physicians that she was nauseated with this feeling but denies this when I ask her specifically about it.  She denies any associated visual symptoms, dysarthria, dysphagia, weakness, numbness, or alteration in sensorium.  She has felt better since her arrival in the emergency room.  There has been no associated chest pain, palpitation, shortness of breath.  PAST MEDICAL HISTORY:  Remarkable for hypertension, heart disease, and questionable dementia.  She says that she "doesnt remember" things.  FAMILY HISTORY:  Noncontributory.  SOCIAL HISTORY:  She lives alone.  She does not use alcohol or tobacco.  MEDICATIONS:  Corgard, triamterene/HCTZ, and Zantac.  REVIEW OF SYSTEMS:  No fever, chills, cough, UTI symptoms.  No chest  pain, palpitations, or shortness of breath.  PHYSICAL EXAMINATION:  VITAL SIGNS:  Temperature 97.0, blood pressure supine 138/63 with pulse 60, sitting 155/86 with pulse 74, standing 177/65 with pulse 71.  GENERAL:  She is alert, fully oriented, and in no evident distress.  HEAD:  Normocephalic, atraumatic.  Oropharynx is benign.  NECK:  Supple without bruits.  HEART:  Regular rate and rhythm with a systolic ejection murmur.  NEUROLOGIC:  Mental status:  She is oriented.  Memory is intact to recent events.  Speech is fluent and not dysarthric.  Cranial nerve exam: Funduscopic exam is benign.  Pupils are equal and briskly reactive.  Visual fields full to confrontation.  Extraocular movements are normal without nystagmus.  Facial strength and sensation are normal.  Tongue and palate move well and in the midline.  Dix-Hallpike maneuvers elicit no nystagmus.  Motor exam:  Normal bulk and tone, normal strength in all tested extremity muscles. Sensation is intact to light touch and vibration throughout.  Cerebellar: Rapid alternating movements are normal, finger-to-nose is normal.  Deep tendon reflexes are 1+ globally.  Toes are downgoing.  Gait exam is deferred.  She does report dizziness on sitting up, but this does not elicit any nystagmus, and the dizzy sensation fatigues with repeated maneuver.  LABORATORY DATA:  Hemoglobin 15, hematocrit 44.  BMET is normal except for glucose of 121.  EKG  reveals normal sinus rhythm with first degree AV block and left bundle branch block.  CT of the head reveals atrophy but no acute changes.  IMPRESSION:  Dizziness, suspect benign positional vertigo.  There is no supporting evidence of a central event.  RECOMMENDATIONS: 1. Check MRI of the brain to rule out a  central event.  This will probably be    normal. 2. Aspirin q.d. for stroke prophylaxis. 3. Phenergan 12.5-25 mg p.o. q.6h. p.r.n. symptoms. 4. When it occurs, mobilization and  vestibular exercises, which would help    to fatigue sensation of vertigo. DD:  05/06/00 TD:  05/06/00 Job: [id number]

## 2011-01-01 NOTE — H&P (Signed)
NAME:  [name] [name], [name] [name] NO.:  [id number]   MEDICAL RECORD NO.:  [id number]                   PATIENT TYPE:  INP   LOCATION:  3727                                 FACILITY:  [facility]   PHYSICIAN:  Learta Codding, M.D. [facility]             DATE OF BIRTH:  1920-04-13   DATE OF ADMISSION:  06/12/2002  DATE OF DISCHARGE:                                HISTORY & PHYSICAL   CARDIOLOGIST:  Arturo Morton. Riley Kill, M.D. [facility]   CHIEF COMPLAINT:  Dizziness occurring this morning and mild substernal chest  pressure.   HISTORY OF PRESENT ILLNESS:  The patient is an 77 year old female with  history of benign positional vertigo with recurrent dizziness with  questionable coronary artery disease.  The patient is status post (according  to the patient's history) a cardiac catheterization performed by Dr. Riley Kill  in September of 1998.  We have not been able to secure these records and in  reviewing procedures done in the catheterization lab we could not find her  procedure.  I am questioning whether this truly was done.  Nevertheless the  patient was complaining of dizziness after she woke up this morning when she  was trying to walk to the bathroom.  She denies being presyncopal, however,  but felt like things were spinning around her . Of note is that she was  evaluated for similar complaints on May 06, 2002 when she was  diagnosed with severe vertigo, which was benign positional in nature with  small vessel cerebrovascular disease.  Carotid Dopplers at that time were  negative.  At that time she was seen by neurology.  The patient during that  admission also had a CT scan done which showed small vessel disease and a 2-  dimensional echocardiogram which showed normal LV function and mild aortic  insufficiency.  The patient today also reports mild substernal chest  pressure while she was complaining of dizziness, however, she is currently  doing well.  She denies any chest  pain or shortness of breath, no orthopnea,  paroxysmal nocturnal dyspnea.  She also has no chest pain on exertion over  the last several days.  The patient still lives by herself although she does  have Alzheimer's disease.   PAST MEDICAL HISTORY:  1. Hypertension.  2. History of hyperparathyroidism.  3. History of Alzheimer's disease.  4. Status post 2-dimensional echocardiographic study with ejection fraction     of 55 to 65%.   SOCIAL HISTORY:  The patient lives in [place] by herself.  She currently  does not smoke.  She does not drink alcohol.   FAMILY HISTORY:  Mother died in her 16's from myocardial infarction.  Father  died in his 9's from hypertension and cerebrovascular accident.  She has  siblings with no coronary artery disease.   REVIEW OF SYMPTOMS:  Occasional chills, chronic vertigo, chest pain,  dizziness as outlined above.  No frequency, urgency.  No weakness or  numbness.  Increased short term memory loss.   PHYSICAL EXAMINATION:  VITAL SIGNS:  Blood pressure 188/74, heart rate 78  beats per minute, temperature 98.8, respirations 20.  GENERAL:  77 year old African-American female in no apparent distress.  HEENT:  Pupils __________ . Conjunctivae clear.  NECK:  Supple. Normal carotid upstroke.  No carotid bruits.  LUNGS:  Clear.  HEART:  Regular rate and rhythm with 2/6 crescendo/decrescendo murmur at the  left upper sternal border.  No S3.  SKIN:  No rash or lesions.  ABDOMEN:  Soft, nontender.  GENITOURINARY: Deferred.  RECTAL:  Deferred.  EXTREMITIES:  No cyanosis, clubbing or edema.  NEUROLOGICAL:  Patient has dementia but is alert and oriented and grossly  nonfocal.   LABORATORY DATA:  Chest x-ray demonstrates cardiomegaly.  Electrocardiogram  shows normal sinus rhythm with left bundle branch block, rate 73.  No acute  ischemic changes.   Hemoglobin 13.7, white blood cell count 6.4.  Sodium 138, potassium 3.4, BUN  14, creatinine 0.9.  Initial  troponin is negative.   ASSESSMENT/PLAN:  1. Dizziness/vertigo.  This is the patient's main complaint and really the     reason why she presented herself to the hospital, not so much chest pain.     Episodes have been documented in the past and she has seen neurology in     2001.  She does have known benign positional vertigo, albeit with known     small vessel cerebrovascular disease.  The CT scan will be repeated today     with contrast and I do think the patient can be treated in the standard     fashion for benign positional vertigo.  2. Atypical chest pain.  The patient reportedly had a cardiac     catheterization but we cannot find that this was ever done.  We will rule     her out for myocardial infarction.  She does have risk factors for     coronary artery disease.  Will repeat a 2-dimensional echocardiogram and     obtain a Cardiolite study particularly in light of her prior history of     aortic insufficiency.  Cardiolite is to rule out significant coronary     artery disease.  If this study is negative I do feel the patient can     likely be discharged.  3. Hypertension is poorly controlled.  Will continue her current medications     but will add additional therapy as part of her further management.  4. Alzheimer's disease.  5. History of hyperparathyroidism.   DISPOSITION:  CT scan today to rule out cerebrovascular accident or other  neurological event.  Dizziness likely vertiginous in nature and due to  benign postural vertigo.  2-dimensional echocardiogram and Cardiolite will  be ordered as part of her cardiovascular workup.                                               Learta Codding, M.D. [facility]    GED/MEDQ  D:  06/12/2002  T:  06/12/2002  Job:  [id number]   cc:   __________ Andrey Campanile, MD

## 2011-01-01 NOTE — Discharge Summary (Signed)
Why. [facility]  Patient:    [name] [name], [name] [name]                        MRN: [id number] Adm. Date:  [id number] Disc. Date: [id number] Attending:  Drema Halon CC:         [facility]   Discharge Summary  HISTORY OF PRESENT ILLNESS:  This is a 77 year old black female who was admitted with acute onset of acute vertigo with nausea, no vomiting.  She presented to the emergency room and was evaluated by the emergency room physician who performed a CT scan which showed some atrophy without focal findings.  Her labs were generally normal.  She could not sit nor ambulate by herself because of the vertigo, was admitted for hydration and control of vertigo and neurologic consultation with further evaluation.  Past medical history, review of systems and physical exam were well dictated in admission history and physical 24 hours prior, has not returned.  Of the pertinent positives in her past history is the history of surgically repaired hyperparathyroidism.  The patient also has had chronic controlled hypertension.  She had been taking Corgard and diuretic as an outpatient. Physical exam really was unremarkable for a 77 year old female.  Her blood pressure was normal in the 140/70 range, no carotid bruits and no murmurs were appreciated and she had no focal neurologic symptoms other than positional vertigo with mild lateral nystagmus.  HOSPITAL COURSE:  The patient was admitted and placed on IV fluids, given clear liquids, full lab evaluation revealed no acute abnormality.  Neurologic consultation felt that this was not a central disease process vertigo other than small vessel disease of an ASCVD type.  Carotid Dopplers showed no significant plaque and a 2D echocardiogram showed only a mild amount of aortic insufficiency, which is asymptomatic.  The patient was able to sit up and take liquids and eat the next morning.  She was able to have  family members arrange to be at her home.  This was discussed with the patients family in terms of the fact of her mild cerebral atrophy with aging and probable small vessel disease and was suggested she also take an aspirin a day.  She was discharged without complications.  DISCHARGE DIAGNOSES: 1. Severe vertigo, benign positional with small vessel cerebral vascular    disease suspected. 2. Treated hypertension. 3. Status post hyperparathyroidism.  FOLLOWUP:  Follow up in the office in one week.  COMPLICATIONS:  None.  DISCHARGE CONDITION:  Improved.  MEDICATIONS:  Resume prior medications, add aspirin 1 q.a.m. and she has Phenergan 12.5 mg available for use at home should she require.  Careful instructions were given as to prevention of falling and avoidance of quick position change, was well understood by patient and her granddaughter. DD:  05/06/00 TD:  05/08/00 Job: [id number]

## 2011-02-02 ENCOUNTER — Other Ambulatory Visit: Payer: Self-pay | Admitting: Internal Medicine

## 2011-02-02 ENCOUNTER — Other Ambulatory Visit: Payer: Self-pay | Admitting: Cardiology

## 2011-02-03 ENCOUNTER — Encounter: Payer: Self-pay | Admitting: Internal Medicine

## 2011-02-03 ENCOUNTER — Other Ambulatory Visit (INDEPENDENT_AMBULATORY_CARE_PROVIDER_SITE_OTHER): Payer: Medicare Other

## 2011-02-03 ENCOUNTER — Ambulatory Visit (INDEPENDENT_AMBULATORY_CARE_PROVIDER_SITE_OTHER): Payer: Medicare Other | Admitting: Internal Medicine

## 2011-02-03 DIAGNOSIS — I5022 Chronic systolic (congestive) heart failure: Secondary | ICD-10-CM

## 2011-02-03 DIAGNOSIS — M109 Gout, unspecified: Secondary | ICD-10-CM

## 2011-02-03 DIAGNOSIS — N259 Disorder resulting from impaired renal tubular function, unspecified: Secondary | ICD-10-CM

## 2011-02-03 DIAGNOSIS — E785 Hyperlipidemia, unspecified: Secondary | ICD-10-CM

## 2011-02-03 DIAGNOSIS — I10 Essential (primary) hypertension: Secondary | ICD-10-CM

## 2011-02-03 LAB — COMPREHENSIVE METABOLIC PANEL
Albumin: 4.2 g/dL (ref 3.5–5.2)
Alkaline Phosphatase: 39 U/L (ref 39–117)
BUN: 29 mg/dL — ABNORMAL HIGH (ref 6–23)
Calcium: 9.3 mg/dL (ref 8.4–10.5)
Chloride: 104 mEq/L (ref 96–112)
Creatinine, Ser: 1.5 mg/dL — ABNORMAL HIGH (ref 0.4–1.2)
Glucose, Bld: 85 mg/dL (ref 70–99)
Potassium: 4.2 mEq/L (ref 3.5–5.1)

## 2011-02-03 LAB — CBC WITH DIFFERENTIAL/PLATELET
Basophils Relative: 0.4 % (ref 0.0–3.0)
Eosinophils Relative: 1.2 % (ref 0.0–5.0)
HCT: 34.4 % — ABNORMAL LOW (ref 36.0–46.0)
Hemoglobin: 11.7 g/dL — ABNORMAL LOW (ref 12.0–15.0)
Lymphs Abs: 2.1 10*3/uL (ref 0.7–4.0)
MCV: 95.7 fl (ref 78.0–100.0)
Monocytes Absolute: 0.4 10*3/uL (ref 0.1–1.0)
Monocytes Relative: 7 % (ref 3.0–12.0)
Neutro Abs: 3 10*3/uL (ref 1.4–7.7)
Platelets: 144 10*3/uL — ABNORMAL LOW (ref 150.0–400.0)
RBC: 3.6 Mil/uL — ABNORMAL LOW (ref 3.87–5.11)
WBC: 5.5 10*3/uL (ref 4.5–10.5)

## 2011-02-03 LAB — LIPID PANEL
Cholesterol: 141 mg/dL (ref 0–200)
LDL Cholesterol: 66 mg/dL (ref 0–99)
Total CHOL/HDL Ratio: 3
VLDL: 20.6 mg/dL (ref 0.0–40.0)

## 2011-02-03 NOTE — Assessment & Plan Note (Signed)
Check BMP today 

## 2011-02-03 NOTE — Assessment & Plan Note (Signed)
Check FLP today. 

## 2011-02-03 NOTE — Assessment & Plan Note (Signed)
She is euvolemic today 

## 2011-02-03 NOTE — Assessment & Plan Note (Signed)
Her BP is well controlled, I will check her lytes and renal function today 

## 2011-02-03 NOTE — Patient Instructions (Signed)

## 2011-02-03 NOTE — Progress Notes (Signed)
Subjective:    Patient ID: [name] [name], female    DOB: 10/16/1919, 77 y.o.   MRN: [id number]  Hypertension This is a chronic problem. The current episode started more than 1 year ago. The problem has been gradually improving since onset. The problem is controlled. Pertinent negatives include no anxiety, blurred vision, chest pain, headaches, malaise/fatigue, neck pain, orthopnea, palpitations, peripheral edema, PND, shortness of breath or sweats. There are no associated agents to hypertension. Past treatments include ACE inhibitors, beta blockers and diuretics. The current treatment provides significant improvement. There are no compliance problems.       Review of Systems  Constitutional: Negative.  Negative for malaise/fatigue.  HENT: Negative.  Negative for neck pain.   Eyes: Negative.  Negative for blurred vision.  Respiratory: Negative for apnea, cough, choking, chest tightness, shortness of breath and stridor.   Cardiovascular: Negative for chest pain, palpitations, orthopnea, leg swelling and PND.  Gastrointestinal: Negative for nausea, vomiting, abdominal pain, diarrhea, constipation, blood in stool, anal bleeding and rectal pain.  Genitourinary: Negative for dysuria, urgency, frequency, hematuria, flank pain, decreased urine volume, enuresis, difficulty urinating, genital sores and dyspareunia.  Musculoskeletal: Negative.   Skin: Negative.   Neurological: Negative for dizziness, tremors, seizures, syncope, facial asymmetry, speech difficulty, weakness, light-headedness, numbness and headaches.  Hematological: Negative.   Psychiatric/Behavioral: Negative.        Objective:   Physical Exam  Vitals reviewed. Constitutional: She is oriented to person, place, and time. She appears well-developed and well-nourished. No distress.  HENT:  Head: Normocephalic and atraumatic.  Right Ear: External ear normal.  Left Ear: External ear normal.  Nose: Nose normal.  Mouth/Throat:  Oropharynx is clear and moist. No oropharyngeal exudate.  Eyes: Conjunctivae and EOM are normal. Pupils are equal, round, and reactive to light. Right eye exhibits no discharge. Left eye exhibits no discharge. No scleral icterus.  Neck: Normal range of motion. Neck supple. No JVD present. No tracheal deviation present. No thyromegaly present.  Cardiovascular: Normal rate, regular rhythm, normal heart sounds and intact distal pulses.  Exam reveals no gallop and no friction rub.   No murmur heard. Pulmonary/Chest: Effort normal and breath sounds normal. No stridor. No respiratory distress. She has no wheezes. She has no rales. She exhibits no tenderness.  Abdominal: Soft. Bowel sounds are normal. She exhibits no distension and no mass. There is no tenderness. There is no rebound and no guarding.  Musculoskeletal: Normal range of motion. She exhibits no edema and no tenderness.  Lymphadenopathy:    She has no cervical adenopathy.  Neurological: She is alert and oriented to person, place, and time. She has normal reflexes. She displays normal reflexes. No cranial nerve deficit. She exhibits normal muscle tone. Coordination normal.  Skin: Skin is warm and dry. No rash noted. She is not diaphoretic. No erythema. No pallor.  Psychiatric: She has a normal mood and affect. Her behavior is normal. Judgment and thought content normal.         Lab Results  Component Value Date   WBC 5.5 10/15/2010   HGB 11.7* 10/15/2010   HCT 34.0* 10/15/2010   PLT 142.0* 10/15/2010   CHOL 116 10/15/2010   TRIG 116.0 10/15/2010   HDL 53.10 10/15/2010   ALT 18 10/15/2010   AST 22 10/15/2010   NA 142 10/15/2010   K 4.4 10/15/2010   CL 103 10/15/2010   CREATININE 1.8* 10/15/2010   BUN 37* 10/15/2010   CO2 30 10/15/2010   TSH 2.30 10/15/2010  Assessment & Plan:

## 2011-02-03 NOTE — Assessment & Plan Note (Signed)
No flares reported, today I will check her uric acid level and monitor her renal function

## 2011-02-03 NOTE — Assessment & Plan Note (Signed)
I will recheck her renal

## 2011-02-08 ENCOUNTER — Ambulatory Visit: Payer: Medicare Other | Admitting: Internal Medicine

## 2011-03-02 ENCOUNTER — Other Ambulatory Visit: Payer: Self-pay | Admitting: Internal Medicine

## 2011-03-03 NOTE — Telephone Encounter (Signed)
rx faxed to pharmacy

## 2011-03-26 ENCOUNTER — Encounter: Payer: Self-pay | Admitting: Cardiology

## 2011-03-30 ENCOUNTER — Telehealth: Payer: Self-pay | Admitting: *Deleted

## 2011-03-30 NOTE — Telephone Encounter (Signed)
Daughter is req 2 RXs.  1. Manual wheelchair 2. Mechanical chair lift for bath.  OK?

## 2011-03-31 NOTE — Telephone Encounter (Signed)
Pending signature, left daughter VM that RX will be ready for pick up today after 1 pm.

## 2011-03-31 NOTE — Telephone Encounter (Signed)
sure

## 2011-04-06 ENCOUNTER — Other Ambulatory Visit: Payer: Self-pay | Admitting: Cardiology

## 2011-04-22 ENCOUNTER — Ambulatory Visit (INDEPENDENT_AMBULATORY_CARE_PROVIDER_SITE_OTHER): Payer: Medicare Other | Admitting: Cardiology

## 2011-04-22 ENCOUNTER — Encounter: Payer: Self-pay | Admitting: Cardiology

## 2011-04-22 DIAGNOSIS — I5022 Chronic systolic (congestive) heart failure: Secondary | ICD-10-CM

## 2011-04-22 DIAGNOSIS — I10 Essential (primary) hypertension: Secondary | ICD-10-CM

## 2011-04-22 DIAGNOSIS — N259 Disorder resulting from impaired renal tubular function, unspecified: Secondary | ICD-10-CM

## 2011-04-22 NOTE — Patient Instructions (Signed)
Follow up in 6 months with Dr Hochrein.  You will receive a letter in the mail 2 months before you are due.  Please call us when you receive this letter to schedule your follow up appointment.   The current medical regimen is effective;  continue present plan and medications.  

## 2011-04-22 NOTE — Progress Notes (Signed)
HPI The patient presents for followup of her cardiomyopathy. Since she was last seen she said no new cardiovascular complaints. There has been occasional coughing in the night or early morning. However, she is sleeping flat and not describing PND. The patient is quite demented but she denies any chest pressure, neck or arm discomfort. She denies any palpitations, presyncope or syncope. She has no weight gain or edema.  No Known Allergies  Current Outpatient Prescriptions  Medication Sig Dispense Refill  . allopurinol (ZYLOPRIM) 100 MG tablet Take 100 mg by mouth daily.        Marland Kitchen aspirin 81 MG tablet Take 81 mg by mouth daily.        . benazepril (LOTENSIN) 40 MG tablet TAKE ONE TABLET BY MOUTH EVERY DAY  30 tablet  3  . bisoprolol (ZEBETA) 10 MG tablet TAKE ONE TABLET BY MOUTH EVERY DAY  30 tablet  3  . donepezil (ARICEPT) 10 MG tablet TAKE ONE TABLET BY MOUTH EVERY DAY  90 tablet  3  . furosemide (LASIX) 40 MG tablet TAKE ONE TABLET BY MOUTH EVERY DAY  30 tablet  6  . LEXAPRO 10 MG tablet TAKE ONE TABLET BY MOUTH EVERY DAY  90 each  3  . loperamide (IMODIUM A-D) 2 MG tablet Take 2 mg by mouth 4 (four) times daily as needed.        Marland Kitchen LORazepam (ATIVAN) 0.5 MG tablet TAKE ONE TABLET BY MOUTH TWICE DAILY AS NEEDED FOR ANXIETY  180 tablet  1  . memantine (NAMENDA) 10 MG tablet Take 1 tablet (10 mg total) by mouth 2 (two) times daily.  180 tablet  1  . Multiple Vitamin (MULTIVITAMIN) tablet Take 1 tablet by mouth daily.        . Probiotic Product (SUPER PROBIOTIC) CAPS Take 1 capsule by mouth daily.        . ranitidine (ZANTAC) 75 MG tablet Take 75 mg by mouth daily.        . simvastatin (ZOCOR) 80 MG tablet TAKE ONE TABLET BY MOUTH EVERY DAY  30 tablet  6  . traZODone (DESYREL) 50 MG tablet TAKE ONE TABLET BY MOUTH EVERY DAY AS NEEDED  90 tablet  3  . DISCONTD: memantine (NAMENDA) 10 MG tablet Take 1 tablet (10 mg total) by mouth 2 (two) times daily.  180 tablet  1    Past Medical History    Diagnosis Date  . Hypertension   . CAD (coronary artery disease)     non-obstructive  . Alzheimer disease     Mild dementia  . Benign positional vertigo   . Degenerative joint disease   . Dyslipidemia     Mild  . Gout   . Renal insufficiency   . CM (congenital malformation)     non-ischemic CM, EF= 25-35% (echo 6/04)  . History of hypoparathyroidism   . Pulmonary edema     s/p ventilator-dependent respiratory failure, cardiac enzymes show no distinct pattern or elevation (6/04)  . Anxiety     Past Surgical History  Procedure Date  . Cesarean section   . Hernia repair   . Parathyroidectomy     Status post  . Cataract extraction, bilateral     Status post    ROS:  As stated in the HPI and negative for all other systems.  PHYSICAL EXAM BP 138/74  Pulse 60  Resp 18  Ht 5\' 3"  (1.6 m)  Wt 162 lb (73.483 kg)  BMI 28.70 kg/m2 GENERAL:  Well appearing HEENT:  Pupils equal round and reactive, fundi not visualized, oral mucosa unremarkable NECK:  No jugular venous distention, waveform within normal limits, carotid upstroke brisk and symmetric, no bruits, no thyromegaly LYMPHATICS:  No cervical, inguinal adenopathy LUNGS:  Clear to auscultation bilaterally BACK:  No CVA tenderness CHEST:  Unremarkable HEART:  PMI not displaced or sustained,S1 and S2 within normal limits, no S3, no S4, no clicks, no rubs,soft apical systolic murmur ABD:  Flat, positive bowel sounds normal in frequency in pitch, no bruits, no rebound, no guarding, no midline pulsatile mass, no hepatomegaly, no splenomegaly EXT:  2 plus pulses throughout, no edema, no cyanosis no clubbing SKIN:  No rashes no nodules NEURO:  Cranial nerves II through XII grossly intact, motor grossly intact throughout PSYCH:  Cognitively intact, oriented to person place and time  EKG:  Sinus rate 57, first degree AV block with LBBB.  ASSESSMENT AND PLAN

## 2011-04-22 NOTE — Assessment & Plan Note (Signed)
The blood pressure is at target. No change in medications is indicated. We will continue with therapeutic lifestyle changes (TLC).  

## 2011-04-22 NOTE — Assessment & Plan Note (Signed)
This point I don't think she is volume overloaded. Her cough might be reflux or sinus drainage. I discussed with her daughter the possibility of putting the head of the bed up on bricks.  She will try this.  I will keep the meds as listed.

## 2011-04-22 NOTE — Assessment & Plan Note (Signed)
Her labs in June were stable.  No change in therapy is indicated.

## 2011-06-04 ENCOUNTER — Other Ambulatory Visit: Payer: Self-pay | Admitting: Cardiology

## 2011-06-04 ENCOUNTER — Other Ambulatory Visit: Payer: Self-pay | Admitting: Internal Medicine

## 2011-06-07 ENCOUNTER — Encounter: Payer: Self-pay | Admitting: Internal Medicine

## 2011-06-07 ENCOUNTER — Ambulatory Visit (INDEPENDENT_AMBULATORY_CARE_PROVIDER_SITE_OTHER): Payer: Medicare Other | Admitting: Internal Medicine

## 2011-06-07 VITALS — BP 122/64 | HR 71 | Temp 98.4°F | Resp 16 | Wt 161.0 lb

## 2011-06-07 DIAGNOSIS — I10 Essential (primary) hypertension: Secondary | ICD-10-CM

## 2011-06-07 DIAGNOSIS — M109 Gout, unspecified: Secondary | ICD-10-CM

## 2011-06-07 DIAGNOSIS — I5022 Chronic systolic (congestive) heart failure: Secondary | ICD-10-CM

## 2011-06-07 MED ORDER — OLMESARTAN MEDOXOMIL 20 MG PO TABS: 20.0000 mg | ORAL_TABLET | Freq: Every day | ORAL | Status: DC

## 2011-06-07 MED ORDER — ALLOPURINOL 100 MG PO TABS: 100.0000 mg | ORAL_TABLET | Freq: Every day | ORAL | Status: DC

## 2011-06-07 NOTE — Patient Instructions (Signed)

## 2011-06-07 NOTE — Assessment & Plan Note (Signed)
She has a normal volume status

## 2011-06-07 NOTE — Assessment & Plan Note (Signed)
No flares, will continue the same for now

## 2011-06-07 NOTE — Progress Notes (Signed)
Subjective:    Patient ID: [name] [name], female    DOB: 1920/07/15, 77 y.o.   MRN: [id number]  Cough This is a chronic problem. The current episode started more than 1 month ago. The problem has been unchanged. The problem occurs constantly. The cough is non-productive. Associated symptoms include rhinorrhea. Pertinent negatives include no chest pain, chills, ear congestion, ear pain, fever, headaches, heartburn, hemoptysis, myalgias, nasal congestion, postnasal drip, rash, sore throat, shortness of breath, sweats, weight loss or wheezing. She has tried nothing for the symptoms.      Review of Systems  Constitutional: Negative for fever, chills, weight loss, diaphoresis, activity change, appetite change, fatigue and unexpected weight change.  HENT: Positive for rhinorrhea. Negative for ear pain, sore throat, facial swelling, sneezing, trouble swallowing, neck pain, neck stiffness, voice change and postnasal drip.   Eyes: Negative.   Respiratory: Positive for cough. Negative for apnea, hemoptysis, choking, chest tightness, shortness of breath, wheezing and stridor.   Cardiovascular: Negative for chest pain, palpitations and leg swelling.  Gastrointestinal: Negative for heartburn, nausea, vomiting, abdominal pain, diarrhea, constipation, blood in stool, abdominal distention, anal bleeding and rectal pain.  Genitourinary: Negative for dysuria, urgency, frequency, hematuria, flank pain, decreased urine volume, enuresis, difficulty urinating and dyspareunia.  Musculoskeletal: Negative for myalgias, back pain, arthralgias and gait problem.  Skin: Negative for color change, pallor, rash and wound.  Neurological: Negative for dizziness, tremors, seizures, syncope, facial asymmetry, speech difficulty, weakness, light-headedness, numbness and headaches.  Hematological: Negative for adenopathy. Does not bruise/bleed easily.  Psychiatric/Behavioral: Negative.        Objective:   Physical Exam  Vitals  reviewed. Constitutional: She is oriented to person, place, and time. She appears well-developed and well-nourished. No distress.  HENT:  Head: Normocephalic.  Nose: Nose normal.  Mouth/Throat: Oropharynx is clear and moist. No oropharyngeal exudate.  Eyes: Conjunctivae are normal. Right eye exhibits no discharge. Left eye exhibits no discharge. No scleral icterus.  Neck: Normal range of motion. Neck supple. No JVD present. No tracheal deviation present. No thyromegaly present.  Cardiovascular: Normal rate, regular rhythm and intact distal pulses.  Exam reveals no gallop and no friction rub.   Murmur heard. Pulmonary/Chest: Effort normal and breath sounds normal. No stridor. No respiratory distress. She has no wheezes. She has no rales. She exhibits no tenderness.  Abdominal: Soft. Bowel sounds are normal. She exhibits no distension and no mass. There is no tenderness. There is no rebound and no guarding.  Musculoskeletal: Normal range of motion. She exhibits no edema and no tenderness.  Lymphadenopathy:    She has no cervical adenopathy.  Neurological: She is oriented to person, place, and time. She displays normal reflexes. No cranial nerve deficit. She exhibits normal muscle tone. Coordination normal.  Skin: Skin is warm and dry. No rash noted. She is not diaphoretic. No erythema. No pallor.  Psychiatric: She has a normal mood and affect. Her behavior is normal. Judgment and thought content normal.      Lab Results  Component Value Date   WBC 5.5 02/03/2011   HGB 11.7* 02/03/2011   HCT 34.4* 02/03/2011   PLT 144.0* 02/03/2011   GLUCOSE 85 02/03/2011   CHOL 141 02/03/2011   TRIG 103.0 02/03/2011   HDL 54.40 02/03/2011   LDLCALC 66 02/03/2011   ALT 22 02/03/2011   AST 25 02/03/2011   NA 144 02/03/2011   K 4.2 02/03/2011   CL 104 02/03/2011   CREATININE 1.5* 02/03/2011   BUN 29* 02/03/2011  CO2 33* 02/03/2011   TSH 2.57 02/03/2011      Assessment & Plan:

## 2011-06-07 NOTE — Assessment & Plan Note (Signed)
I think her cough is caused by the ACEI so I have stopped that and have changes her to benicar

## 2011-07-19 ENCOUNTER — Telehealth: Payer: Self-pay | Admitting: *Deleted

## 2011-07-19 DIAGNOSIS — I10 Essential (primary) hypertension: Secondary | ICD-10-CM

## 2011-07-19 MED ORDER — LOSARTAN POTASSIUM 50 MG PO TABS: 50.0000 mg | ORAL_TABLET | Freq: Every day | ORAL | Status: DC

## 2011-07-19 NOTE — Telephone Encounter (Signed)
Pt's daughter would like to know if there is a low cost alternative to Benicar-OOP cost $110. mth Please advise.

## 2011-07-19 NOTE — Telephone Encounter (Signed)
Changed to a generic 

## 2011-07-20 NOTE — Telephone Encounter (Signed)
Patient informed via daughter.

## 2011-09-04 ENCOUNTER — Other Ambulatory Visit: Payer: Self-pay | Admitting: Internal Medicine

## 2011-09-07 ENCOUNTER — Other Ambulatory Visit: Payer: Self-pay | Admitting: Internal Medicine

## 2011-10-19 ENCOUNTER — Ambulatory Visit (INDEPENDENT_AMBULATORY_CARE_PROVIDER_SITE_OTHER): Payer: Medicare Other | Admitting: Cardiology

## 2011-10-19 DIAGNOSIS — R001 Bradycardia, unspecified: Secondary | ICD-10-CM | POA: Insufficient documentation

## 2011-10-19 DIAGNOSIS — I5022 Chronic systolic (congestive) heart failure: Secondary | ICD-10-CM

## 2011-10-19 DIAGNOSIS — I498 Other specified cardiac arrhythmias: Secondary | ICD-10-CM

## 2011-10-19 DIAGNOSIS — I10 Essential (primary) hypertension: Secondary | ICD-10-CM

## 2011-10-19 DIAGNOSIS — N259 Disorder resulting from impaired renal tubular function, unspecified: Secondary | ICD-10-CM

## 2011-10-19 LAB — BASIC METABOLIC PANEL
CO2: 29 mEq/L (ref 19–32)
Calcium: 9.2 mg/dL (ref 8.4–10.5)
Creatinine, Ser: 1.3 mg/dL — ABNORMAL HIGH (ref 0.4–1.2)
GFR: 48.18 mL/min — ABNORMAL LOW (ref >60.00)

## 2011-10-19 NOTE — Assessment & Plan Note (Signed)
I will check a BMET.   

## 2011-10-19 NOTE — Assessment & Plan Note (Signed)
I discussed with the patient's daughter today bradycardia and conduction disturbance on the EKG. There is no indication for pacing at this time but they understand that should she have any presyncope or syncopal spells in the future bradycardia arrhythmia will be a very likely possibility as an etiology should be notified right away.

## 2011-10-19 NOTE — Assessment & Plan Note (Signed)
She seems to be euvolemic.  At this point, no change in therapy is indicated.  We have reviewed salt and fluid restrictions.  No further cardiovascular testing is indicated.   

## 2011-10-19 NOTE — Assessment & Plan Note (Signed)
Her blood pressure seems to be controlled with the switch to ARB. We'll continue the meds as listed.

## 2011-10-19 NOTE — Progress Notes (Signed)
HPI The patient presents for followup of her cardiomyopathy. Since I last saw her she was switched off of her ACE inhibitor because of cough. She was switched to an ARB and has had much less of a cough. She's denying any acute symptoms such as PND or orthopnea. She's not having any chest pressure, neck or arm discomfort. He denies any palpitations, presyncope or syncope. She gets around slowly.  Allergies  Allergen Reactions  . Benazepril     cough    Current Outpatient Prescriptions  Medication Sig Dispense Refill  . allopurinol (ZYLOPRIM) 100 MG tablet Take 1 tablet (100 mg total) by mouth daily.  30 tablet  11  . aspirin 81 MG tablet Take 81 mg by mouth daily.        . bisoprolol (ZEBETA) 10 MG tablet TAKE ONE TABLET BY MOUTH EVERY DAY  30 tablet  11  . donepezil (ARICEPT) 10 MG tablet TAKE ONE TABLET BY MOUTH EVERY DAY  90 tablet  3  . furosemide (LASIX) 40 MG tablet TAKE ONE TABLET BY MOUTH EVERY DAY  30 tablet  6  . LEXAPRO 10 MG tablet TAKE ONE TABLET BY MOUTH EVERY DAY  90 each  3  . loperamide (IMODIUM A-D) 2 MG tablet Take 2 mg by mouth 4 (four) times daily as needed.        Marland Kitchen LORazepam (ATIVAN) 0.5 MG tablet TAKE ONE TABLET BY MOUTH TWICE DAILY AS NEEDED FOR ANXIETY  60 tablet  5  . losartan (COZAAR) 50 MG tablet Take 1 tablet (50 mg total) by mouth daily.  90 tablet  3  . Multiple Vitamin (MULTIVITAMIN) tablet Take 1 tablet by mouth daily.        Marland Kitchen NAMENDA 10 MG tablet TAKE ONE TABLET BY MOUTH TWICE DAILY  60 each  11  . ranitidine (ZANTAC) 75 MG tablet Take 75 mg by mouth daily.        . simvastatin (ZOCOR) 80 MG tablet TAKE ONE TABLET BY MOUTH EVERY DAY  30 tablet  6  . traZODone (DESYREL) 50 MG tablet TAKE ONE TABLET BY MOUTH EVERY DAY AS NEEDED  90 tablet  3    Past Medical History  Diagnosis Date  . Hypertension   . CAD (coronary artery disease)     non-obstructive  . Alzheimer disease     Mild dementia  . Benign positional vertigo   . Degenerative joint  disease   . Dyslipidemia     Mild  . Gout   . Renal insufficiency   . CM (congenital malformation)     non-ischemic CM, EF= 25-35% (echo 6/04)  . History of hypoparathyroidism   . Pulmonary edema     s/p ventilator-dependent respiratory failure, cardiac enzymes show no distinct pattern or elevation (6/04)  . Anxiety     Past Surgical History  Procedure Date  . Cesarean section   . Hernia repair   . Parathyroidectomy     Status post  . Cataract extraction, bilateral     Status post    ROS:  As stated in the HPI and negative for all other systems.  PHYSICAL EXAM BP 140/60  Pulse 66  Ht 5\' 2"  (1.575 m)  Wt 168 lb (76.204 kg)  BMI 30.73 kg/m2 GENERAL:  Well appearing for her age NECK:  No jugular venous distention, waveform within normal limits, carotid upstroke brisk and symmetric, no bruits, no thyromegaly LYMPHATICS:  No cervical, inguinal adenopathy LUNGS:  Clear to auscultation bilaterally  BACK:  No CVA tenderness CHEST:  Unremarkable HEART:  PMI not displaced or sustained,S1 and S2 within normal limits, no S3, no S4, no clicks, no rubs,soft apical systolic murmur ABD:  Flat, positive bowel sounds normal in frequency in pitch, no bruits, no rebound, no guarding, no midline pulsatile mass, no hepatomegaly, no splenomegaly EXT:  2 plus pulses throughout, no edema, no cyanosis no clubbing SKIN:  No rashes no nodules, multiple skin tags  EKG:  Sinus rate 66, first degree AV block with LBBB. 10/19/2011  ASSESSMENT AND PLAN

## 2011-10-19 NOTE — Patient Instructions (Signed)
Please have blood work today. (BMP)  The current medical regimen is effective;  continue present plan and medications.  Follow up with Dr Antoine Poche in 4 months.

## 2011-10-20 ENCOUNTER — Encounter: Payer: Self-pay | Admitting: Cardiology

## 2011-11-06 ENCOUNTER — Other Ambulatory Visit: Payer: Self-pay | Admitting: Cardiology

## 2011-11-08 NOTE — Telephone Encounter (Signed)
..   Requested Prescriptions   Pending Prescriptions Disp Refills  . simvastatin (ZOCOR) 80 MG tablet [Pharmacy Med Name: SIMVASTATIN 80MG     TAB] 30 tablet 11    Sig: TAKE ONE TABLET BY MOUTH EVERY DAY

## 2011-12-06 ENCOUNTER — Other Ambulatory Visit: Payer: Self-pay | Admitting: Internal Medicine

## 2011-12-06 ENCOUNTER — Other Ambulatory Visit: Payer: Self-pay | Admitting: Cardiology

## 2011-12-06 NOTE — Telephone Encounter (Signed)
Refilled furosemide

## 2012-03-02 ENCOUNTER — Ambulatory Visit (INDEPENDENT_AMBULATORY_CARE_PROVIDER_SITE_OTHER): Payer: Medicare Other | Admitting: Cardiology

## 2012-03-02 ENCOUNTER — Encounter: Payer: Self-pay | Admitting: Cardiology

## 2012-03-02 VITALS — BP 119/72 | HR 60 | Ht 61.0 in | Wt 163.0 lb

## 2012-03-02 DIAGNOSIS — I498 Other specified cardiac arrhythmias: Secondary | ICD-10-CM

## 2012-03-02 DIAGNOSIS — I10 Essential (primary) hypertension: Secondary | ICD-10-CM

## 2012-03-02 DIAGNOSIS — E785 Hyperlipidemia, unspecified: Secondary | ICD-10-CM

## 2012-03-02 DIAGNOSIS — I5022 Chronic systolic (congestive) heart failure: Secondary | ICD-10-CM

## 2012-03-02 DIAGNOSIS — R001 Bradycardia, unspecified: Secondary | ICD-10-CM

## 2012-03-02 NOTE — Patient Instructions (Addendum)
The current medical regimen is effective;  continue present plan and medications.  Follow up in 6 months with Dr Hochrein.  You will receive a letter in the mail 2 months before you are due.  Please call us when you receive this letter to schedule your follow up appointment.  

## 2012-03-02 NOTE — Progress Notes (Signed)
HPI The patient presents for followup of her cardiomyopathy. Since I last saw her she has done well.  She has a severe memory disorder. Her daughter takes excellent care of her but is becoming quite fatigued with this.  The patient is not demonstrating any new shortness of breath, PND or orthopnea. There are no palpitations, presyncope or syncope. There is no chest pressure, neck or arm discomfort.   Allergies  Allergen Reactions  . Benazepril     cough    Current Outpatient Prescriptions  Medication Sig Dispense Refill  . allopurinol (ZYLOPRIM) 100 MG tablet Take 1 tablet (100 mg total) by mouth daily.  30 tablet  11  . aspirin 81 MG tablet Take 81 mg by mouth daily.        . bisoprolol (ZEBETA) 10 MG tablet TAKE ONE TABLET BY MOUTH EVERY DAY  30 tablet  11  . donepezil (ARICEPT) 10 MG tablet TAKE ONE TABLET BY MOUTH EVERY DAY  90 tablet  3  . escitalopram (LEXAPRO) 10 MG tablet TAKE ONE TABLET BY MOUTH EVERY DAY  90 tablet  3  . furosemide (LASIX) 40 MG tablet TAKE ONE TABLET BY MOUTH EVERY DAY  30 tablet  11  . LORazepam (ATIVAN) 0.5 MG tablet TAKE ONE TABLET BY MOUTH TWICE DAILY AS NEEDED FOR ANXIETY  60 tablet  5  . losartan (COZAAR) 50 MG tablet Take 1 tablet (50 mg total) by mouth daily.  90 tablet  3  . Multiple Vitamin (MULTIVITAMIN) tablet Take 1 tablet by mouth daily.        Marland Kitchen NAMENDA 10 MG tablet TAKE ONE TABLET BY MOUTH TWICE DAILY  60 each  11  . ranitidine (ZANTAC) 75 MG tablet Take 75 mg by mouth daily.        . simvastatin (ZOCOR) 80 MG tablet TAKE ONE TABLET BY MOUTH EVERY DAY  30 tablet  11  . traZODone (DESYREL) 50 MG tablet TAKE ONE TABLET BY MOUTH EVERY DAY AS NEEDED  90 tablet  3    Past Medical History  Diagnosis Date  . Hypertension   . CAD (coronary artery disease)     non-obstructive  . Alzheimer disease     Mild dementia  . Benign positional vertigo   . Degenerative joint disease   . Dyslipidemia     Mild  . Gout   . Renal insufficiency   . CM  (congenital malformation)     non-ischemic CM, EF= 25-35% (echo 6/04)  . History of hypoparathyroidism   . Pulmonary edema     s/p ventilator-dependent respiratory failure, cardiac enzymes show no distinct pattern or elevation (6/04)  . Anxiety     Past Surgical History  Procedure Date  . Cesarean section   . Hernia repair   . Parathyroidectomy     Status post  . Cataract extraction, bilateral     Status post    ROS:  As stated in the HPI and negative for all other systems.  PHYSICAL EXAM BP 119/72  Pulse 60  Ht 5\' 1"  (1.549 m)  Wt 163 lb (73.936 kg)  BMI 30.80 kg/m2  SpO2 94% GENERAL:  Well appearing for her age NECK:  No jugular venous distention, waveform within normal limits, carotid upstroke brisk and symmetric, no bruits, no thyromegaly LYMPHATICS:  No cervical, inguinal adenopathy LUNGS:  Clear to auscultation bilaterally BACK:  No CVA tenderness CHEST:  Unremarkable HEART:  PMI not displaced or sustained,S1 and S2 within normal limits, no  S3, no S4, no clicks, no rubs,soft apical systolic murmur ABD:  Flat, positive bowel sounds normal in frequency in pitch, no bruits, no rebound, no guarding, no midline pulsatile mass, no hepatomegaly, no splenomegaly EXT:  2 plus pulses throughout, no edema, no cyanosis no clubbing SKIN:  No rashes no nodules, multiple skin tags  EKG:  Sinus rate 60, first degree AV block with LBBB. 03/02/2012   ASSESSMENT AND PLAN   Bradycardia -  I discussed with the patient's daughter today bradycardia and conduction disturbance on the EKG. There is no indication for pacing at this time but they understand that should she have any presyncope or syncopal spells in the future bradycardia arrhythmia will be a very likely possibility as an etiology should be notified right away.  SYSTOLIC HEART FAILURE, CHRONIC -  She seems to be euvolemic. At this point, no change in therapy is indicated.  No further cardiovascular testing is indicated.    RENAL INSUFFICIENCY -  Her creat was stable at the last blood draw.  No further evaluation is indicated.    ESSENTIAL HYPERTENSION, BENIGN -  Her blood pressure seems to be controlled.  She is no longer coughing since stopping ARB. She will continue the meds as listed.

## 2012-04-02 ENCOUNTER — Other Ambulatory Visit: Payer: Self-pay | Admitting: Internal Medicine

## 2012-06-07 ENCOUNTER — Other Ambulatory Visit: Payer: Self-pay | Admitting: Internal Medicine

## 2012-06-07 ENCOUNTER — Other Ambulatory Visit: Payer: Self-pay | Admitting: Cardiology

## 2012-06-07 ENCOUNTER — Other Ambulatory Visit: Payer: Self-pay

## 2012-06-07 MED ORDER — MEMANTINE HCL 10 MG PO TABS: ORAL_TABLET | ORAL | Status: DC

## 2012-06-07 MED ORDER — ALLOPURINOL 100 MG PO TABS: ORAL_TABLET | ORAL | Status: DC

## 2012-06-07 NOTE — Telephone Encounter (Signed)
Refill- Pt Daughter Blanca Friend  [phone], called to request 90 day refill for these medication, verified preferred pharmacy [facility] [address]. If there is a problem getting 90 day refill plz return call to daughter

## 2012-08-06 ENCOUNTER — Other Ambulatory Visit: Payer: Self-pay | Admitting: Internal Medicine

## 2012-08-30 ENCOUNTER — Encounter: Payer: Self-pay | Admitting: Cardiology

## 2012-08-30 ENCOUNTER — Ambulatory Visit (INDEPENDENT_AMBULATORY_CARE_PROVIDER_SITE_OTHER): Payer: Medicare Other | Admitting: Cardiology

## 2012-08-30 VITALS — BP 158/71 | HR 70 | Ht 62.0 in | Wt 161.0 lb

## 2012-08-30 DIAGNOSIS — I498 Other specified cardiac arrhythmias: Secondary | ICD-10-CM

## 2012-08-30 DIAGNOSIS — R001 Bradycardia, unspecified: Secondary | ICD-10-CM

## 2012-08-30 DIAGNOSIS — N259 Disorder resulting from impaired renal tubular function, unspecified: Secondary | ICD-10-CM

## 2012-08-30 DIAGNOSIS — I5022 Chronic systolic (congestive) heart failure: Secondary | ICD-10-CM

## 2012-08-30 DIAGNOSIS — I10 Essential (primary) hypertension: Secondary | ICD-10-CM

## 2012-08-30 DIAGNOSIS — R0989 Other specified symptoms and signs involving the circulatory and respiratory systems: Secondary | ICD-10-CM

## 2012-08-30 NOTE — Progress Notes (Signed)
HPI The patient presents for followup of her cardiomyopathy. Since I last saw her she has done well.  She has a severe memory disorder. Today she has a cold.  She has had 3 days of upper respiratory symptoms.  There have been no acute complaints of progressive dyspnea, PND or orthopnea. There's been no report of presyncope or syncope or symptomatic bradycardia arrhythmias. She's getting around in a wheelchair today because she is weaker. This comes and goes.  Allergies  Allergen Reactions  . Benazepril     cough    Current Outpatient Prescriptions  Medication Sig Dispense Refill  . allopurinol (ZYLOPRIM) 100 MG tablet TAKE ONE TABLET (100 MG TOTAL) BY MOUTH EVERY DAY  90 tablet  3  . aspirin 81 MG tablet Take 81 mg by mouth daily.        . bisoprolol (ZEBETA) 10 MG tablet TAKE ONE TABLET BY MOUTH EVERY DAY  30 tablet  6  . donepezil (ARICEPT) 10 MG tablet TAKE ONE TABLET BY MOUTH EVERY DAY  90 tablet  3  . escitalopram (LEXAPRO) 10 MG tablet TAKE ONE TABLET BY MOUTH EVERY DAY  90 tablet  3  . furosemide (LASIX) 40 MG tablet TAKE ONE TABLET BY MOUTH EVERY DAY  30 tablet  11  . LORazepam (ATIVAN) 0.5 MG tablet TAKE ONE TABLET BY MOUTH TWICE DAILY AS NEEDED FOR ANXIETY  30 tablet  5  . losartan (COZAAR) 50 MG tablet TAKE ONE TABLET BY MOUTH EVERY DAY  90 tablet  2  . memantine (NAMENDA) 10 MG tablet TAKE ONE TABLET BY MOUTH TWICE DAILY  180 tablet  3  . Multiple Vitamin (MULTIVITAMIN) tablet Take 1 tablet by mouth daily.        . ranitidine (ZANTAC) 75 MG tablet Take 75 mg by mouth daily.        . simvastatin (ZOCOR) 80 MG tablet TAKE ONE TABLET BY MOUTH EVERY DAY  30 tablet  11  . traZODone (DESYREL) 50 MG tablet TAKE ONE TABLET BY MOUTH EVERY DAY AS NEEDED  90 tablet  3    Past Medical History  Diagnosis Date  . Hypertension   . CAD (coronary artery disease)     non-obstructive  . Alzheimer disease     Mild dementia  . Benign positional vertigo   . Degenerative joint disease   .  Dyslipidemia     Mild  . Gout   . Renal insufficiency   . CM (congenital malformation)     non-ischemic CM, EF= 25-35% (echo 6/04)  . History of hypoparathyroidism   . Pulmonary edema     s/p ventilator-dependent respiratory failure, cardiac enzymes show no distinct pattern or elevation (6/04)  . Anxiety     Past Surgical History  Procedure Date  . Cesarean section   . Hernia repair   . Parathyroidectomy     Status post  . Cataract extraction, bilateral     Status post    ROS:  As stated in the HPI and negative for all other systems.  PHYSICAL EXAM BP 158/71  Pulse 70  Ht 5\' 2"  (1.575 m)  Wt 161 lb (73.029 kg)  BMI 29.45 kg/m2 GENERAL:  Well appearing for her age NECK:  No jugular venous distention at 90 degrees, waveform within normal limits, carotid upstroke brisk and symmetric, no bruits, no thyromegaly LUNGS:  Clear to auscultation bilaterally BACK:  No CVA tenderness CHEST:  Unremarkable, , diffuse upper airway transmitted breath sounds. HEART:  PMI not displaced or sustained,S1 and S2 within normal limits, no S3, no S4, no clicks, no rubs,soft apical systolic murmur ABD:  Flat, positive bowel sounds normal in frequency in pitch, no bruits, no rebound, no guarding, no midline pulsatile mass, no hepatomegaly, no splenomegaly EXT:  2 plus pulses throughout, no edema, no cyanosis no clubbing SKIN:  No rashes no nodules, multiple skin tags  EKG:  Sinus rate 70, first degree AV block with LBBB. 08/30/2012   ASSESSMENT AND PLAN   Bradycardia -  I again discussed with the patient's daughter today bradycardia and conduction disturbance on the EKG. There is no indication for pacing at this time but they understand that should she have any presyncope or syncopal spells in the future bradycardia arrhythmia will be a very likely possibility as an etiology should be notified right away and she could present to the ER.  SYSTOLIC HEART FAILURE, CHRONIC -  She seems to be  euvolemic. At this point, no change in therapy is indicated.  No further cardiovascular testing is indicated.   RENAL INSUFFICIENCY -  She's not feeling well today and so I won't draw blood work. However, this should be repeated at either her next primary care appointment or when I see her back.  ESSENTIAL HYPERTENSION, BENIGN -  Her blood pressure is up slightly today but hasn't been usually. Therefore, I will make no changes to her medical regimen. I did review previous blood pressure readings.

## 2012-08-30 NOTE — Patient Instructions (Addendum)
The current medical regimen is effective;  continue present plan and medications.  Follow up in 6 months with Dr Hochrein.  You will receive a letter in the mail 2 months before you are due.  Please call us when you receive this letter to schedule your follow up appointment.  

## 2012-10-04 ENCOUNTER — Other Ambulatory Visit: Payer: Self-pay | Admitting: Internal Medicine

## 2012-11-04 ENCOUNTER — Other Ambulatory Visit: Payer: Self-pay | Admitting: Cardiology

## 2012-11-04 ENCOUNTER — Other Ambulatory Visit: Payer: Self-pay | Admitting: Internal Medicine

## 2012-11-06 ENCOUNTER — Other Ambulatory Visit: Payer: Self-pay | Admitting: Internal Medicine

## 2012-11-06 NOTE — Telephone Encounter (Signed)
Lorazepam request [last Rx 02.19.14 #30x0]/SLS Please advise.

## 2012-11-08 NOTE — Telephone Encounter (Signed)
Last seen by Dr. Yetta Barre Oct '12. Refill denied. Forward to Dr. Yetta Barre to see if he agrees.

## 2012-12-03 ENCOUNTER — Other Ambulatory Visit: Payer: Self-pay | Admitting: Internal Medicine

## 2012-12-03 ENCOUNTER — Other Ambulatory Visit: Payer: Self-pay | Admitting: Cardiology

## 2012-12-04 NOTE — Telephone Encounter (Signed)
Lorazepam called to pharamcy.

## 2013-01-02 ENCOUNTER — Other Ambulatory Visit: Payer: Self-pay | Admitting: Internal Medicine

## 2013-01-02 ENCOUNTER — Other Ambulatory Visit: Payer: Self-pay | Admitting: Cardiology

## 2013-01-03 ENCOUNTER — Other Ambulatory Visit: Payer: Self-pay | Admitting: Internal Medicine

## 2013-01-03 NOTE — Telephone Encounter (Signed)
Lorazepam called to pharmacy  

## 2013-01-04 NOTE — Telephone Encounter (Signed)
..   Requested Prescriptions   Pending Prescriptions Disp Refills  . bisoprolol (ZEBETA) 10 MG tablet [Pharmacy Med Name: BISOPROL FUM 10MG    TAB] 30 tablet 5    Sig: TAKE ONE TABLET BY MOUTH EVERY DAY

## 2013-02-01 ENCOUNTER — Other Ambulatory Visit: Payer: Self-pay | Admitting: Internal Medicine

## 2013-02-05 ENCOUNTER — Other Ambulatory Visit: Payer: Self-pay | Admitting: *Deleted

## 2013-02-06 MED ORDER — LORAZEPAM 0.5 MG PO TABS: 0.5000 mg | ORAL_TABLET | Freq: Three times a day (TID) | ORAL | Status: DC | PRN

## 2013-02-06 NOTE — Telephone Encounter (Signed)
Please advise refill? 

## 2013-02-07 ENCOUNTER — Other Ambulatory Visit: Payer: Self-pay | Admitting: Internal Medicine

## 2013-02-27 ENCOUNTER — Encounter: Payer: Self-pay | Admitting: Cardiology

## 2013-02-27 ENCOUNTER — Ambulatory Visit (INDEPENDENT_AMBULATORY_CARE_PROVIDER_SITE_OTHER): Payer: Medicare Other | Admitting: Cardiology

## 2013-02-27 VITALS — BP 140/70 | HR 64 | Ht 62.0 in | Wt 158.4 lb

## 2013-02-27 DIAGNOSIS — R001 Bradycardia, unspecified: Secondary | ICD-10-CM

## 2013-02-27 DIAGNOSIS — N259 Disorder resulting from impaired renal tubular function, unspecified: Secondary | ICD-10-CM

## 2013-02-27 DIAGNOSIS — I10 Essential (primary) hypertension: Secondary | ICD-10-CM

## 2013-02-27 DIAGNOSIS — I498 Other specified cardiac arrhythmias: Secondary | ICD-10-CM

## 2013-02-27 DIAGNOSIS — I5022 Chronic systolic (congestive) heart failure: Secondary | ICD-10-CM

## 2013-02-27 LAB — BASIC METABOLIC PANEL
BUN: 21 mg/dL (ref 6–23)
CO2: 31 mEq/L (ref 19–32)
Calcium: 9.6 mg/dL (ref 8.4–10.5)
Creatinine, Ser: 1.5 mg/dL — ABNORMAL HIGH (ref 0.4–1.2)
GFR: 41.81 mL/min — ABNORMAL LOW (ref >60.00)
Glucose, Bld: 102 mg/dL — ABNORMAL HIGH (ref 70–99)
Sodium: 142 mEq/L (ref 135–145)

## 2013-02-27 NOTE — Progress Notes (Signed)
HPI The patient presents for followup of her cardiomyopathy. Since I last saw her she has had a couple of episodes of diaphoresis and shortness of breath. One episode woke her from her sleep. Her daughter watch her that day and she had a few more less significant episodes following this but none in the last 3 weeks. The patient is not saying very much today but denies any acute symptoms. She has otherwise been at her baseline. There's been no obvious chest discomfort, no PND or orthopnea, no presyncope or syncope.  Allergies  Allergen Reactions  . Benazepril     cough    Current Outpatient Prescriptions  Medication Sig Dispense Refill  . allopurinol (ZYLOPRIM) 100 MG tablet TAKE ONE TABLET (100 MG TOTAL) BY MOUTH EVERY DAY  90 tablet  3  . aspirin 81 MG tablet Take 81 mg by mouth daily.        . bisoprolol (ZEBETA) 10 MG tablet TAKE ONE TABLET BY MOUTH EVERY DAY  30 tablet  5  . donepezil (ARICEPT) 10 MG tablet TAKE ONE TABLET BY MOUTH EVERY DAY  90 tablet  3  . escitalopram (LEXAPRO) 10 MG tablet TAKE ONE TABLET BY MOUTH EVERY DAY  90 tablet  3  . furosemide (LASIX) 40 MG tablet TAKE ONE TABLET BY MOUTH EVERY DAY  30 tablet  5  . LORazepam (ATIVAN) 0.5 MG tablet Take 1 tablet (0.5 mg total) by mouth every 8 (eight) hours as needed for anxiety.  60 tablet  4  . losartan (COZAAR) 50 MG tablet TAKE ONE TABLET BY MOUTH EVERY DAY  90 tablet  2  . memantine (NAMENDA) 10 MG tablet TAKE ONE TABLET BY MOUTH TWICE DAILY  180 tablet  3  . Multiple Vitamin (MULTIVITAMIN) tablet Take 1 tablet by mouth daily.        . ranitidine (ZANTAC) 75 MG tablet Take 75 mg by mouth daily.        . simvastatin (ZOCOR) 80 MG tablet TAKE ONE TABLET BY MOUTH ONCE DAILY  30 tablet  5  . traZODone (DESYREL) 50 MG tablet TAKE ONE TABLET BY MOUTH EVERY DAY AS NEEDED  90 tablet  3   No current facility-administered medications for this visit.    Past Medical History  Diagnosis Date  . Hypertension   . CAD (coronary  artery disease)     non-obstructive  . Alzheimer disease     Mild dementia  . Benign positional vertigo   . Degenerative joint disease   . Dyslipidemia     Mild  . Gout   . Renal insufficiency   . CM (congenital malformation)     non-ischemic CM, EF= 25-35% (echo 6/04)  . History of hypoparathyroidism   . Pulmonary edema     s/p ventilator-dependent respiratory failure, cardiac enzymes show no distinct pattern or elevation (6/04)  . Anxiety     Past Surgical History  Procedure Laterality Date  . Cesarean section    . Hernia repair    . Parathyroidectomy      Status post  . Cataract extraction, bilateral      Status post    ROS:  As stated in the HPI and negative for all other systems.  PHYSICAL EXAM BP 140/70  Pulse 64  Ht 5\' 2"  (1.575 m)  Wt 158 lb 6.4 oz (71.85 kg)  BMI 28.96 kg/m2 GENERAL:  Well appearing for her age but somnolent NECK:  No jugular venous distention at 90 degrees, waveform  within normal limits, carotid upstroke brisk and symmetric, no bruits, no thyromegaly LUNGS:  Clear to auscultation bilaterally CHEST:  Unremarkable, , diffuse upper airway transmitted breath sounds. HEART:  PMI not displaced or sustained,S1 and S2 within normal limits, no S3, no S4, no clicks, no rubs,soft apical systolic murmur ABD:  Flat, positive bowel sounds normal in frequency in pitch, no bruits, no rebound, no guarding, no midline pulsatile mass, no hepatomegaly, no splenomegaly EXT:  2 plus pulses throughout, no edema, no cyanosis no clubbing SKIN:  No rashes no nodules, multiple skin tags  EKG:  Sinus rate 64, first degree AV block with LBBB. 02/27/2013   ASSESSMENT AND PLAN   Bradycardia -  I am not sure recent acute complaints.  She could have had some transient heart block.  However, we would prefer conservative therapies. If she has ever any sustained symptoms her daughter will call EMS.  SYSTOLIC HEART FAILURE, CHRONIC -  She seems to be euvolemic. At this  point, no change in therapy is indicated.  No further cardiovascular testing is indicated.   RENAL INSUFFICIENCY -  I will check a BMET today.   ESSENTIAL HYPERTENSION, BENIGN -  The blood pressure is at target. No change in medications is indicated. We will continue with therapeutic lifestyle changes (TLC).

## 2013-02-27 NOTE — Patient Instructions (Addendum)
The current medical regimen is effective;  continue present plan and medications.  Please have blood work today  Follow up in 6 months with Dr Antoine Poche.  You will receive a letter in the mail 2 months before you are due.  Please call us when you receive this letter to schedule your follow up appointment.

## 2013-04-26 ENCOUNTER — Telehealth: Payer: Self-pay | Admitting: Cardiology

## 2013-04-26 NOTE — Telephone Encounter (Signed)
My mother Handicapped Stick has expired.  Need a new one.  Understand that Dr. Antoine Poche is not back to next week. Can anyone sign one for her.

## 2013-04-26 NOTE — Telephone Encounter (Signed)
LMTCB 9/11@ 5:52pm/PE

## 2013-04-27 NOTE — Telephone Encounter (Signed)
Daughter called requesting handicapped sticker for her Mom.  Current one has expired and she needs this week-end.  Has been advised Dr. Antoine Poche is not in office but she really needs this week.  Spoke w/Dr. Shirlee Latch (DOD) and he signed form.  Daughter will pick up this afternoon.  Will leave at front desk.

## 2013-04-27 NOTE — Telephone Encounter (Signed)
F/up  Pt returned call..   Mother Handicapped Stick has expired. Need a new one. Understand that Dr. Antoine Poche is not back to next week. Can anyone sign one for her.

## 2013-05-03 ENCOUNTER — Other Ambulatory Visit: Payer: Self-pay | Admitting: Internal Medicine

## 2013-05-05 ENCOUNTER — Other Ambulatory Visit: Payer: Self-pay | Admitting: Internal Medicine

## 2013-05-09 ENCOUNTER — Other Ambulatory Visit: Payer: Self-pay | Admitting: Internal Medicine

## 2013-05-11 ENCOUNTER — Telehealth: Payer: Self-pay

## 2013-05-11 MED ORDER — ALLOPURINOL 100 MG PO TABS: ORAL_TABLET | ORAL | Status: DC

## 2013-05-11 MED ORDER — LOSARTAN POTASSIUM 50 MG PO TABS: ORAL_TABLET | ORAL | Status: DC

## 2013-05-11 MED ORDER — MEMANTINE HCL 10 MG PO TABS: ORAL_TABLET | ORAL | Status: DC

## 2013-05-11 NOTE — Telephone Encounter (Signed)
Patient caregiver called [facility] stating that med refill denied due to needing appt. appt has been schedule for 05/31/13, only sent 16 days worth

## 2013-05-31 ENCOUNTER — Other Ambulatory Visit (INDEPENDENT_AMBULATORY_CARE_PROVIDER_SITE_OTHER): Payer: Medicare Other

## 2013-05-31 ENCOUNTER — Encounter: Payer: Self-pay | Admitting: Internal Medicine

## 2013-05-31 ENCOUNTER — Ambulatory Visit (INDEPENDENT_AMBULATORY_CARE_PROVIDER_SITE_OTHER): Payer: Medicare Other | Admitting: Internal Medicine

## 2013-05-31 VITALS — BP 142/68 | HR 58 | Temp 98.5°F | Resp 16 | Ht 62.0 in | Wt 157.0 lb

## 2013-05-31 DIAGNOSIS — E785 Hyperlipidemia, unspecified: Secondary | ICD-10-CM

## 2013-05-31 DIAGNOSIS — I5022 Chronic systolic (congestive) heart failure: Secondary | ICD-10-CM

## 2013-05-31 DIAGNOSIS — I10 Essential (primary) hypertension: Secondary | ICD-10-CM

## 2013-05-31 DIAGNOSIS — F0390 Unspecified dementia without behavioral disturbance: Secondary | ICD-10-CM

## 2013-05-31 DIAGNOSIS — Z23 Encounter for immunization: Secondary | ICD-10-CM

## 2013-05-31 DIAGNOSIS — M109 Gout, unspecified: Secondary | ICD-10-CM

## 2013-05-31 DIAGNOSIS — N259 Disorder resulting from impaired renal tubular function, unspecified: Secondary | ICD-10-CM

## 2013-05-31 DIAGNOSIS — F068 Other specified mental disorders due to known physiological condition: Secondary | ICD-10-CM

## 2013-05-31 DIAGNOSIS — F411 Generalized anxiety disorder: Secondary | ICD-10-CM

## 2013-05-31 LAB — CBC WITH DIFFERENTIAL/PLATELET
Basophils Absolute: 0 10*3/uL (ref 0.0–0.1)
Eosinophils Absolute: 0.1 10*3/uL (ref 0.0–0.7)
HCT: 39 % (ref 36.0–46.0)
Hemoglobin: 13.2 g/dL (ref 12.0–15.0)
Lymphs Abs: 1.4 10*3/uL (ref 0.7–4.0)
MCHC: 33.8 g/dL (ref 30.0–36.0)
Monocytes Relative: 6.4 % (ref 3.0–12.0)
Neutro Abs: 2.3 10*3/uL (ref 1.4–7.7)
Neutrophils Relative %: 57.2 % (ref 43.0–77.0)
Platelets: 132 10*3/uL — ABNORMAL LOW (ref 150.0–400.0)
RBC: 4.1 Mil/uL (ref 3.87–5.11)
RDW: 14.5 % (ref 11.5–14.6)

## 2013-05-31 LAB — COMPREHENSIVE METABOLIC PANEL
ALT: 23 U/L (ref 0–35)
AST: 31 U/L (ref 0–37)
Albumin: 3.7 g/dL (ref 3.5–5.2)
Alkaline Phosphatase: 39 U/L (ref 39–117)
Chloride: 105 mEq/L (ref 96–112)
Creatinine, Ser: 1.5 mg/dL — ABNORMAL HIGH (ref 0.4–1.2)
GFR: 43.46 mL/min — ABNORMAL LOW (ref >60.00)
Glucose, Bld: 107 mg/dL — ABNORMAL HIGH (ref 70–99)
Potassium: 4 mEq/L (ref 3.5–5.1)
Sodium: 144 mEq/L (ref 135–145)
Total Bilirubin: 0.7 mg/dL (ref 0.3–1.2)
Total Protein: 6.8 g/dL (ref 6.0–8.3)

## 2013-05-31 LAB — LIPID PANEL
Cholesterol: 123 mg/dL (ref 0–200)
HDL: 58.8 mg/dL (ref >39.00)
LDL Cholesterol: 49 mg/dL (ref 0–99)
Total CHOL/HDL Ratio: 2
Triglycerides: 77 mg/dL (ref 0.0–149.0)

## 2013-05-31 LAB — HM DEXA SCAN

## 2013-05-31 LAB — TSH: TSH: 2.54 u[IU]/mL (ref 0.35–5.50)

## 2013-05-31 MED ORDER — LORAZEPAM 0.5 MG PO TABS: 0.5000 mg | ORAL_TABLET | Freq: Three times a day (TID) | ORAL | Status: AC | PRN

## 2013-05-31 MED ORDER — TRAZODONE HCL 50 MG PO TABS: 50.0000 mg | ORAL_TABLET | Freq: Every day | ORAL | Status: AC

## 2013-05-31 MED ORDER — ALLOPURINOL 100 MG PO TABS: ORAL_TABLET | ORAL | Status: DC

## 2013-05-31 MED ORDER — LOSARTAN POTASSIUM 50 MG PO TABS: ORAL_TABLET | ORAL | Status: DC

## 2013-05-31 MED ORDER — RANITIDINE HCL 75 MG PO TABS: 75.0000 mg | ORAL_TABLET | Freq: Two times a day (BID) | ORAL | Status: DC

## 2013-05-31 MED ORDER — SIMVASTATIN 80 MG PO TABS: 80.0000 mg | ORAL_TABLET | Freq: Every day | ORAL | Status: AC

## 2013-05-31 MED ORDER — DONEPEZIL HCL 10 MG PO TABS: 10.0000 mg | ORAL_TABLET | Freq: Every day | ORAL | Status: AC

## 2013-05-31 MED ORDER — ASPIRIN 81 MG PO TABS: 81.0000 mg | ORAL_TABLET | Freq: Every day | ORAL | Status: AC

## 2013-05-31 MED ORDER — MEMANTINE HCL ER 28 MG PO CP24: 1.0000 | ORAL_CAPSULE | Freq: Every day | ORAL | Status: AC

## 2013-05-31 NOTE — Progress Notes (Signed)
Subjective:    Patient ID: [name] [name], female    DOB: 12/20/1919, 79 y.o.   MRN: [id number]  HPI Comments: She returns today with her daughter, the pt does not offer any concerns but the history is difficult due to dementia. Her daughter is with her and tells me that she thinks her mother is doing well.     Review of Systems  Unable to perform ROS: Dementia       Objective:   Physical Exam  Vitals reviewed. Constitutional: She is oriented to person, place, and time. She appears well-developed and well-nourished.  Non-toxic appearance. She does not have a sickly appearance. She does not appear ill. No distress.  She is confined to a wheelchair  HENT:  Head: Normocephalic and atraumatic.  Mouth/Throat: Oropharynx is clear and moist. No oropharyngeal exudate.  Eyes: Conjunctivae are normal. Right eye exhibits no discharge. Left eye exhibits no discharge. No scleral icterus.  Neck: Normal range of motion. Neck supple. No JVD present. No tracheal deviation present. No thyromegaly present.  Cardiovascular: Normal rate, regular rhythm, S1 normal, S2 normal and intact distal pulses.  Exam reveals no gallop, no S4 and no friction rub.   Murmur heard.  Decrescendo systolic murmur is present with a grade of 1/6   No diastolic murmur is present  Pulses:      Carotid pulses are 1+ on the right side, and 1+ on the left side.      Radial pulses are 1+ on the right side, and 1+ on the left side.       Femoral pulses are 1+ on the right side, and 1+ on the left side.      Popliteal pulses are 1+ on the right side, and 1+ on the left side.       Dorsalis pedis pulses are 1+ on the right side, and 1+ on the left side.       Posterior tibial pulses are 1+ on the right side, and 1+ on the left side.  Pulmonary/Chest: Effort normal and breath sounds normal. No stridor. No respiratory distress. She has no wheezes. She has no rales. She exhibits no tenderness.  Abdominal: Soft. Bowel sounds are normal.  She exhibits no distension and no mass. There is no tenderness. There is no rebound and no guarding.  Musculoskeletal: Normal range of motion. She exhibits no edema and no tenderness.  Lymphadenopathy:    She has no cervical adenopathy.  Neurological: She is oriented to person, place, and time.  Skin: Skin is warm and dry. No rash noted. She is not diaphoretic. No erythema. No pallor.  Psychiatric: Judgment and thought content normal. Her mood appears not anxious. Her affect is not angry, not blunt, not labile and not inappropriate. Her speech is delayed and tangential. She is slowed and withdrawn. Cognition and memory are impaired. She does not exhibit a depressed mood. She exhibits abnormal recent memory and abnormal remote memory. She is inattentive.     Lab Results  Component Value Date   WBC 5.5 02/03/2011   HGB 11.7* 02/03/2011   HCT 34.4* 02/03/2011   PLT 144.0* 02/03/2011   GLUCOSE 102* 02/27/2013   CHOL 141 02/03/2011   TRIG 103.0 02/03/2011   HDL 54.40 02/03/2011   LDLCALC 66 02/03/2011   ALT 22 02/03/2011   AST 25 02/03/2011   NA 142 02/27/2013   K 4.1 02/27/2013   CL 106 02/27/2013   CREATININE 1.5* 02/27/2013   BUN 21 02/27/2013   CO2  31 02/27/2013   TSH 2.57 02/03/2011       Assessment & Plan:

## 2013-05-31 NOTE — Patient Instructions (Signed)

## 2013-06-01 ENCOUNTER — Encounter: Payer: Self-pay | Admitting: Internal Medicine

## 2013-06-01 NOTE — Assessment & Plan Note (Signed)
Her BP is well controlled Today I will check his lytes and renal function

## 2013-06-01 NOTE — Assessment & Plan Note (Signed)
No changes noted 

## 2013-06-01 NOTE — Assessment & Plan Note (Signed)
FLP today 

## 2013-06-01 NOTE — Assessment & Plan Note (Signed)
She will continue the current meds

## 2013-06-01 NOTE — Assessment & Plan Note (Signed)
She is doing well on allopurinol Today I will check her uric acid level and renal function

## 2013-06-01 NOTE — Assessment & Plan Note (Signed)
She has a normal volume status today 

## 2013-06-04 ENCOUNTER — Telehealth: Payer: Self-pay | Admitting: Internal Medicine

## 2013-06-04 DIAGNOSIS — I10 Essential (primary) hypertension: Secondary | ICD-10-CM

## 2013-06-04 DIAGNOSIS — I5022 Chronic systolic (congestive) heart failure: Secondary | ICD-10-CM

## 2013-06-04 MED ORDER — FUROSEMIDE 40 MG PO TABS: 40.0000 mg | ORAL_TABLET | Freq: Every day | ORAL | Status: DC

## 2013-06-04 NOTE — Telephone Encounter (Signed)
Please advise if this request is ok for me to change to BID. Thanks

## 2013-06-04 NOTE — Telephone Encounter (Signed)
Patients care giver is calling because the Namenda was written for 1x daily and the patient actually takes this 2x daily (once in the morning and once at night) she would like this to be called in to [facility] as well as a refill on Furosemide

## 2013-06-04 NOTE — Telephone Encounter (Signed)
The new namenda is once a day

## 2013-06-05 NOTE — Telephone Encounter (Signed)
[facility] notifying caller

## 2013-06-29 ENCOUNTER — Other Ambulatory Visit: Payer: Self-pay | Admitting: Cardiology

## 2013-07-30 ENCOUNTER — Ambulatory Visit (INDEPENDENT_AMBULATORY_CARE_PROVIDER_SITE_OTHER): Payer: Medicare Other | Admitting: Interventional Cardiology

## 2013-07-30 ENCOUNTER — Encounter: Payer: Self-pay | Admitting: Interventional Cardiology

## 2013-07-30 ENCOUNTER — Telehealth: Payer: Self-pay | Admitting: Cardiology

## 2013-07-30 VITALS — BP 130/78 | HR 64 | Ht 62.0 in | Wt 157.0 lb

## 2013-07-30 DIAGNOSIS — R0609 Other forms of dyspnea: Secondary | ICD-10-CM

## 2013-07-30 DIAGNOSIS — I498 Other specified cardiac arrhythmias: Secondary | ICD-10-CM | POA: Insufficient documentation

## 2013-07-30 DIAGNOSIS — N259 Disorder resulting from impaired renal tubular function, unspecified: Secondary | ICD-10-CM

## 2013-07-30 DIAGNOSIS — I10 Essential (primary) hypertension: Secondary | ICD-10-CM

## 2013-07-30 DIAGNOSIS — I5022 Chronic systolic (congestive) heart failure: Secondary | ICD-10-CM

## 2013-07-30 LAB — BASIC METABOLIC PANEL
BUN: 28 mg/dL — ABNORMAL HIGH (ref 6–23)
Chloride: 109 mEq/L (ref 96–112)
Glucose, Bld: 140 mg/dL — ABNORMAL HIGH (ref 70–99)
Potassium: 3.6 mEq/L (ref 3.5–5.1)

## 2013-07-30 NOTE — Telephone Encounter (Signed)
Pt's daughter called because she states pt has been having SOB with mild activity for the last 2 days; also pt has experience one time this week, like a asthma attack with  cold sweats that lasted for 2 hours. Pt would like to be seen soon. An appointment was made with Dr. Nechama Guard today at 1 11:15 AM daughter aware.

## 2013-07-30 NOTE — Progress Notes (Signed)
Patient ID: [name] [name], female   DOB: Dec 28, 1919, 79 y.o.   MRN: [id number]    HPI The patient presents for shortness of breath in the setting of a  Cardiomyopathy (EF 25-35%).  She had a severe episode of severe SHOB, wheezing on 12/11.  She could not decide if she had to sit up or lie down to breath.  There was audible wheezing and gurgling, it lasted over a hour.  There was diaphoresis.  The patient felt that she may be dying.  She calmed down and went to sleep.  This happened after walking after visiting a an assisted living facility where she may go live.   She walks in the house, but this has decreased over the past few monhs.  There is a concern for weight gain at home.  The patient is not saying very much today but denies any acute symptoms. Since Thursday, her [facility] has been better, but she is not at baseline. Since Thursday, there's been no obvious chest discomfort, no PND or orthopnea, no presyncope or syncope.  She does recall eating out for a holiday meal at a facility, prior to this episode last Thursday.  Allergies  Allergen Reactions  . Benazepril     cough    Current Outpatient Prescriptions  Medication Sig Dispense Refill  . allopurinol (ZYLOPRIM) 100 MG tablet TAKE ONE TABLET (100 MG TOTAL) BY MOUTH EVERY DAY  90 tablet  0  . aspirin 81 MG tablet Take 1 tablet (81 mg total) by mouth daily.  90 tablet  3  . bisoprolol (ZEBETA) 10 MG tablet TAKE ONE TABLET BY MOUTH ONCE DAILY  30 tablet  1  . donepezil (ARICEPT) 10 MG tablet Take 1 tablet (10 mg total) by mouth at bedtime.  90 tablet  3  . escitalopram (LEXAPRO) 10 MG tablet TAKE ONE TABLET BY MOUTH EVERY DAY  90 tablet  3  . furosemide (LASIX) 40 MG tablet Take 1 tablet (40 mg total) by mouth daily.  90 tablet  1  . LORazepam (ATIVAN) 0.5 MG tablet Take 1 tablet (0.5 mg total) by mouth every 8 (eight) hours as needed for anxiety.  60 tablet  4  . losartan (COZAAR) 50 MG tablet TAKE ONE TABLET BY MOUTH EVERY DAY  90 tablet  3    . Memantine HCl ER (NAMENDA XR) 28 MG CP24 Take 28 mg by mouth daily.  90 capsule  3  . Multiple Vitamin (MULTIVITAMIN) tablet Take 1 tablet by mouth daily.        . ranitidine (ZANTAC) 75 MG tablet Take 1 tablet (75 mg total) by mouth 2 (two) times daily.  180 tablet  3  . simvastatin (ZOCOR) 80 MG tablet Take 1 tablet (80 mg total) by mouth at bedtime.  90 tablet  3  . traZODone (DESYREL) 50 MG tablet Take 1 tablet (50 mg total) by mouth at bedtime.  90 tablet  3   No current facility-administered medications for this visit.    Past Medical History  Diagnosis Date  . Hypertension   . CAD (coronary artery disease)     non-obstructive  . Alzheimer disease     Mild dementia  . Benign positional vertigo   . Degenerative joint disease   . Dyslipidemia     Mild  . Gout   . Renal insufficiency   . CM (congenital malformation)     non-ischemic CM, EF= 25-35% (echo 6/04)  . History of hypoparathyroidism   . Pulmonary  edema     s/p ventilator-dependent respiratory failure, cardiac enzymes show no distinct pattern or elevation (6/04)  . Anxiety     Past Surgical History  Procedure Laterality Date  . Cesarean section    . Hernia repair    . Parathyroidectomy      Status post  . Cataract extraction, bilateral      Status post    ROS:  As stated in the HPI and negative for all other systems.  PHYSICAL EXAM BP 130/78  Pulse 64  Ht 5\' 2"  (1.575 m)  Wt 157 lb (71.215 kg)  BMI 28.71 kg/m2 GENERAL:  Well appearing for her age but somnolent NECK:  No jugular venous distention at 90 degrees, waveform within normal limits, carotid upstroke brisk and symmetric, no bruits, no thyromegaly LUNGS:  Clear to auscultation bilaterally CHEST:  Unremarkable, , diffuse upper airway transmitted breath sounds. HEART:  PMI not displaced or sustained,S1 and S2 within normal limits, no S3, no S4, no clicks, no rubs,soft apical systolic murmur ABD:  Flat, positive bowel sounds normal in frequency  in pitch, no bruits, no rebound, no guarding, no midline pulsatile mass, no hepatomegaly, no splenomegaly EXT:  2 plus pulses throughout, no edema, no cyanosis no clubbing SKIN:  No rashes no nodules, multiple skin tags  EKG:  Sinus rate 64, first degree AV block with LBBB. 07/30/2013   ASSESSMENT AND PLAN   Bradycardia -  No syncope.  HR 64 on ECG  SYSTOLIC HEART FAILURE, CHRONIC - SHOB She seems to be euvolemic. At this point, no change in therapy is indicated.  Her episode  of [facility] concerning for fluid overload.  Check BNP, BMet and will adjust diuretics.  RENAL INSUFFICIENCY -  I will check a BMET today.   ESSENTIAL HYPERTENSION, BENIGN -  The blood pressure is at target. No change in medications is indicated. We will continue with therapeutic lifestyle changes (TLC).

## 2013-07-30 NOTE — Telephone Encounter (Signed)
New Message  Pt daughter called// Pt is experiencing S.O.B when she stands up// Please call back for Same day appt//SR

## 2013-07-30 NOTE — Patient Instructions (Signed)
Your physician recommends that you return for lab work today for Bmet and Bnp.  Your physician recommends that you follow up with Dr. Antoine Poche as scheduled. See recall in system and schedule pt for appointment today.

## 2013-07-31 ENCOUNTER — Telehealth: Payer: Self-pay | Admitting: Cardiology

## 2013-07-31 DIAGNOSIS — I10 Essential (primary) hypertension: Secondary | ICD-10-CM

## 2013-07-31 DIAGNOSIS — I5022 Chronic systolic (congestive) heart failure: Secondary | ICD-10-CM

## 2013-07-31 MED ORDER — POTASSIUM CHLORIDE CRYS ER 20 MEQ PO TBCR: 20.0000 meq | EXTENDED_RELEASE_TABLET | Freq: Every day | ORAL | Status: DC

## 2013-07-31 MED ORDER — FUROSEMIDE 40 MG PO TABS: ORAL_TABLET | ORAL | Status: DC

## 2013-07-31 NOTE — Telephone Encounter (Signed)
Message copied by Theda Sers on Tue Jul 31, 2013  4:30 PM ------      Message from: Corky Crafts      Created: Tue Jul 31, 2013  4:23 PM       Increase Lasix to 40 mg by mouth twice a day. Add potassium 20 mEq daily. Repeat  bmet in one week with followup with either Dr. Antoine Poche or Tereso Newcomer in one week. ------

## 2013-07-31 NOTE — Telephone Encounter (Signed)
Lasix refilled and kdur sent in. Appt made with Tereso Newcomer, PA for 08/07/13.

## 2013-08-07 ENCOUNTER — Encounter: Payer: Self-pay | Admitting: Physician Assistant

## 2013-08-07 ENCOUNTER — Ambulatory Visit (INDEPENDENT_AMBULATORY_CARE_PROVIDER_SITE_OTHER): Payer: Medicare Other | Admitting: Physician Assistant

## 2013-08-07 ENCOUNTER — Institutional Professional Consult (permissible substitution): Payer: Medicare Other | Admitting: Physician Assistant

## 2013-08-07 ENCOUNTER — Telehealth: Payer: Self-pay | Admitting: *Deleted

## 2013-08-07 VITALS — BP 120/68 | HR 57 | Ht 62.0 in | Wt 162.0 lb

## 2013-08-07 DIAGNOSIS — I5022 Chronic systolic (congestive) heart failure: Secondary | ICD-10-CM

## 2013-08-07 DIAGNOSIS — I10 Essential (primary) hypertension: Secondary | ICD-10-CM

## 2013-08-07 DIAGNOSIS — R0602 Shortness of breath: Secondary | ICD-10-CM

## 2013-08-07 DIAGNOSIS — E785 Hyperlipidemia, unspecified: Secondary | ICD-10-CM

## 2013-08-07 DIAGNOSIS — I428 Other cardiomyopathies: Secondary | ICD-10-CM | POA: Insufficient documentation

## 2013-08-07 LAB — BASIC METABOLIC PANEL
CO2: 30 mEq/L (ref 19–32)
Calcium: 9.1 mg/dL (ref 8.4–10.5)
GFR: 34.51 mL/min — ABNORMAL LOW (ref >60.00)
Glucose, Bld: 121 mg/dL — ABNORMAL HIGH (ref 70–99)
Potassium: 4.1 mEq/L (ref 3.5–5.1)
Sodium: 143 mEq/L (ref 135–145)

## 2013-08-07 LAB — BRAIN NATRIURETIC PEPTIDE: Pro B Natriuretic peptide (BNP): 388 pg/mL — ABNORMAL HIGH (ref 0.0–100.0)

## 2013-08-07 MED ORDER — FUROSEMIDE 40 MG PO TABS: ORAL_TABLET | ORAL | Status: DC

## 2013-08-07 MED ORDER — POTASSIUM CHLORIDE CRYS ER 20 MEQ PO TBCR: EXTENDED_RELEASE_TABLET | ORAL | Status: DC

## 2013-08-07 NOTE — Progress Notes (Signed)
[address], [address] [place], [place]  [address] Phone: [phone] Fax:  [phone]  Date:  08/07/2013   ID:  [name] [name], DOB 07-Mar-1920, MRN [id number]  PCP:  Sanda Linger, MD  Cardiologist:  Dr. Rollene Rotunda     History of Present Illness: [name] [name] is a 79 y.o. female with a history of nonischemic cardiomyopathy, chronic systolic CHF, CKD stage III, HTN, HL, Alzheimer's dementia.  hc (02/2003): Mid left main 20%, proximal-mid RCA 30-40%.  Echocardiogram (01/2003): EF 25-35%, apical and anterior HK, septal HK-AK, mild AI, mild ascending aortic dilatation, MAC, LA upper limits of normal.  Last seen by Dr. Antoine Poche 02/27/2013. Patient recently presented to the office and saw Dr. Eldridge Dace for increased dyspnea.  BNP returned elevated and her Lasix was adjusted. She was asked to return today for followup.  She is here with her daughter.  She provides the hx given her dementia.  She notes the patient is doing better.  She is less short of breath.  She is probably NYHA Class 2b.  She denies orthopnea, PND, edema.  No chest pain.  No syncope.     Recent Labs: 05/31/2013: ALT 23; HDL 58.80; Hemoglobin 13.2; LDL (calc) 49; TSH 2.54  07/30/2013: Creatinine 1.4*; Potassium 3.6; Pro B Natriuretic peptide (BNP) 3277.0*   Wt Readings from Last 3 Encounters:  08/07/13 162 lb (73.483 kg)  07/30/13 157 lb (71.215 kg)  05/31/13 157 lb (71.215 kg)     Past Medical History  Diagnosis Date  . Hypertension   . CAD (coronary artery disease)     non-obstructive  . Alzheimer disease     Mild dementia  . Benign positional vertigo   . Degenerative joint disease   . Dyslipidemia     Mild  . Gout   . Renal insufficiency   . CM (congenital malformation)     non-ischemic CM, EF= 25-35% (echo 6/04)  . History of hypoparathyroidism   . Pulmonary edema     s/p ventilator-dependent respiratory failure, cardiac enzymes show no distinct pattern or elevation (6/04)  . Anxiety     Current  Outpatient Prescriptions  Medication Sig Dispense Refill  . allopurinol (ZYLOPRIM) 100 MG tablet TAKE ONE TABLET (100 MG TOTAL) BY MOUTH EVERY DAY  90 tablet  0  . aspirin 81 MG tablet Take 1 tablet (81 mg total) by mouth daily.  90 tablet  3  . bisoprolol (ZEBETA) 10 MG tablet TAKE ONE TABLET BY MOUTH ONCE DAILY  30 tablet  1  . donepezil (ARICEPT) 10 MG tablet Take 1 tablet (10 mg total) by mouth at bedtime.  90 tablet  3  . escitalopram (LEXAPRO) 10 MG tablet TAKE ONE TABLET BY MOUTH EVERY DAY  90 tablet  3  . furosemide (LASIX) 40 MG tablet 1 TABLET BY MOUTH TWICE A DAY  60 tablet  1  . LORazepam (ATIVAN) 0.5 MG tablet Take 1 tablet (0.5 mg total) by mouth every 8 (eight) hours as needed for anxiety.  60 tablet  4  . losartan (COZAAR) 50 MG tablet TAKE ONE TABLET BY MOUTH EVERY DAY  90 tablet  3  . Memantine HCl ER (NAMENDA XR) 28 MG CP24 Take 28 mg by mouth daily.  90 capsule  3  . Multiple Vitamin (MULTIVITAMIN) tablet Take 1 tablet by mouth daily.        . potassium chloride SA (K-DUR,KLOR-CON) 20 MEQ tablet Take 1 tablet (20 mEq total) by mouth daily.  30 tablet  1  . ranitidine (ZANTAC) 75 MG tablet Take 1 tablet (75 mg total) by mouth 2 (two) times daily.  180 tablet  3  . simvastatin (ZOCOR) 80 MG tablet Take 1 tablet (80 mg total) by mouth at bedtime.  90 tablet  3  . traZODone (DESYREL) 50 MG tablet Take 1 tablet (50 mg total) by mouth at bedtime.  90 tablet  3   No current facility-administered medications for this visit.    Allergies:   Benazepril   Social History:  The patient  reports that she has quit smoking. She has never used smokeless tobacco. She reports that she does not drink alcohol or use illicit drugs.   Family History:  The patient's family history includes Stroke in her father; Sudden death (age of onset: 75) in her mother.   ROS:  Please see the history of present illness.      All other systems reviewed and negative.   PHYSICAL EXAM: VS:  BP 120/68  Pulse  57  Ht 5\' 2"  (1.575 m)  Wt 162 lb (73.483 kg)  BMI 29.62 kg/m2 Well nourished, well developed, in no acute distress HEENT: normal Neck: no JVD Cardiac:  normal S1, S2; RRR; no murmur Lungs:  clear to auscultation bilaterally, no wheezing, rhonchi or rales Abd: soft, nontender, no hepatomegaly Ext: no edema Skin: warm and dry Neuro:  CNs 2-12 intact, no focal abnormalities noted  EKG:  Sinus brady, HR 57, long 1st degree AVB, PR 402, LBBB, no changes     ASSESSMENT AND PLAN:  1. Chronic Systolic CHF:  Volume improved.  Resume Lasix 40 QD and hold K+.  Check BMET and BNP today.  We discussed weighing daily and when to increase Lasix back to 40 bid.   2. Non-Ischemic Cardiomyopathy:  Continue beta blocker, ARB.   3. Hypertension:  Controlled.  4. Hyperlipidemia:  Continue statin.  5. Chronic Kidney Disease, Stage III:  Check BMET today.  6. Disposition:  F/u with Dr. Rollene Rotunda in 4-6 weeks.    Signed, Tereso Newcomer, PA-C  08/07/2013 9:21 AM

## 2013-08-07 NOTE — Patient Instructions (Signed)
DECREASE LASIX TO 40 MG DAILY HOLD POTASSIUM  LAB WORK TODAY; BMET, BNP  MAKE SURE TO WEIGH DAILY AND IF YOUR WT IS UP BY 3 LB'S IN 1 DAY OR 5 LB'S IN 1 WEEK THEN INCREASE LASIX TO 40 MG TWICE DAILY AND YOU WILL NEED TO RE-START THE POTASSIUM 20 MEQ DAILY; IF YOU DO THIS PER SCOTT WEAVER, PAC YOU NEED TO CALL THE OFFICE TO LET us KNOW OF THE INCREASE AND THE RE-START OF POTASSIUM # [id number]  PLEASE FOLLOW UP WITH DR. HOCHREIN 09/21/13 @ 8 AM

## 2013-08-07 NOTE — Telephone Encounter (Signed)
s/w pt's daughter Derek Jack who has been notified about lab results with verbal understanding. Repeat bmet 08/20/13

## 2013-08-16 ENCOUNTER — Inpatient Hospital Stay (HOSPITAL_COMMUNITY)
Admission: EM | Admit: 2013-08-16 | Discharge: 2013-09-16 | DRG: 291 | Disposition: E | Payer: Medicare Other | Attending: Internal Medicine | Admitting: Internal Medicine

## 2013-08-16 ENCOUNTER — Telehealth: Payer: Self-pay | Admitting: Cardiology

## 2013-08-16 ENCOUNTER — Emergency Department (HOSPITAL_COMMUNITY): Payer: Medicare Other

## 2013-08-16 ENCOUNTER — Encounter (HOSPITAL_COMMUNITY): Payer: Self-pay | Admitting: Emergency Medicine

## 2013-08-16 DIAGNOSIS — M109 Gout, unspecified: Secondary | ICD-10-CM | POA: Diagnosis present

## 2013-08-16 DIAGNOSIS — I5022 Chronic systolic (congestive) heart failure: Secondary | ICD-10-CM

## 2013-08-16 DIAGNOSIS — J9600 Acute respiratory failure, unspecified whether with hypoxia or hypercapnia: Secondary | ICD-10-CM | POA: Diagnosis present

## 2013-08-16 DIAGNOSIS — I5023 Acute on chronic systolic (congestive) heart failure: Secondary | ICD-10-CM | POA: Diagnosis present

## 2013-08-16 DIAGNOSIS — I2510 Atherosclerotic heart disease of native coronary artery without angina pectoris: Secondary | ICD-10-CM | POA: Diagnosis present

## 2013-08-16 DIAGNOSIS — N183 Chronic kidney disease, stage 3 unspecified: Secondary | ICD-10-CM | POA: Diagnosis present

## 2013-08-16 DIAGNOSIS — F411 Generalized anxiety disorder: Secondary | ICD-10-CM | POA: Diagnosis present

## 2013-08-16 DIAGNOSIS — J09X1 Influenza due to identified novel influenza A virus with pneumonia: Secondary | ICD-10-CM | POA: Diagnosis present

## 2013-08-16 DIAGNOSIS — I129 Hypertensive chronic kidney disease with stage 1 through stage 4 chronic kidney disease, or unspecified chronic kidney disease: Secondary | ICD-10-CM | POA: Diagnosis present

## 2013-08-16 DIAGNOSIS — Z515 Encounter for palliative care: Secondary | ICD-10-CM | POA: Diagnosis not present

## 2013-08-16 DIAGNOSIS — I447 Left bundle-branch block, unspecified: Secondary | ICD-10-CM

## 2013-08-16 DIAGNOSIS — I10 Essential (primary) hypertension: Secondary | ICD-10-CM | POA: Diagnosis present

## 2013-08-16 DIAGNOSIS — Z87891 Personal history of nicotine dependence: Secondary | ICD-10-CM

## 2013-08-16 DIAGNOSIS — I428 Other cardiomyopathies: Secondary | ICD-10-CM | POA: Diagnosis present

## 2013-08-16 DIAGNOSIS — G309 Alzheimer's disease, unspecified: Secondary | ICD-10-CM | POA: Diagnosis present

## 2013-08-16 DIAGNOSIS — J111 Influenza due to unidentified influenza virus with other respiratory manifestations: Secondary | ICD-10-CM

## 2013-08-16 DIAGNOSIS — R532 Functional quadriplegia: Secondary | ICD-10-CM | POA: Diagnosis present

## 2013-08-16 DIAGNOSIS — E785 Hyperlipidemia, unspecified: Secondary | ICD-10-CM | POA: Diagnosis present

## 2013-08-16 DIAGNOSIS — R0602 Shortness of breath: Secondary | ICD-10-CM | POA: Diagnosis present

## 2013-08-16 DIAGNOSIS — I509 Heart failure, unspecified: Secondary | ICD-10-CM | POA: Diagnosis present

## 2013-08-16 DIAGNOSIS — Z7982 Long term (current) use of aspirin: Secondary | ICD-10-CM

## 2013-08-16 DIAGNOSIS — Z66 Do not resuscitate: Secondary | ICD-10-CM | POA: Diagnosis present

## 2013-08-16 DIAGNOSIS — F0280 Dementia in other diseases classified elsewhere without behavioral disturbance: Secondary | ICD-10-CM | POA: Diagnosis present

## 2013-08-16 DIAGNOSIS — Z79899 Other long term (current) drug therapy: Secondary | ICD-10-CM

## 2013-08-16 DIAGNOSIS — R4182 Altered mental status, unspecified: Secondary | ICD-10-CM

## 2013-08-16 DIAGNOSIS — J09X2 Influenza due to identified novel influenza A virus with other respiratory manifestations: Secondary | ICD-10-CM

## 2013-08-16 DIAGNOSIS — R5381 Other malaise: Secondary | ICD-10-CM | POA: Diagnosis present

## 2013-08-16 HISTORY — DX: Heart failure, unspecified: I50.9

## 2013-08-16 LAB — COMPREHENSIVE METABOLIC PANEL
ALBUMIN: 3.7 g/dL (ref 3.5–5.2)
ALT: 16 U/L (ref 0–35)
AST: 30 U/L (ref 0–37)
Alkaline Phosphatase: 44 U/L (ref 39–117)
BILIRUBIN TOTAL: 0.6 mg/dL (ref 0.3–1.2)
BUN: 24 mg/dL — ABNORMAL HIGH (ref 6–23)
CO2: 28 mEq/L (ref 19–32)
CREATININE: 1.63 mg/dL — AB (ref 0.50–1.10)
Calcium: 9.2 mg/dL (ref 8.4–10.5)
Chloride: 99 mEq/L (ref 96–112)
GFR calc Af Amer: 31 mL/min — ABNORMAL LOW (ref >90)
GFR calc non Af Amer: 26 mL/min — ABNORMAL LOW (ref >90)
Glucose, Bld: 116 mg/dL — ABNORMAL HIGH (ref 70–99)
Potassium: 4.4 mEq/L (ref 3.7–5.3)
Sodium: 140 mEq/L (ref 137–147)
Total Protein: 7.4 g/dL (ref 6.0–8.3)

## 2013-08-16 LAB — PRO B NATRIURETIC PEPTIDE: Pro B Natriuretic peptide (BNP): 10920 pg/mL — ABNORMAL HIGH (ref 0–450)

## 2013-08-16 LAB — CBC
HEMATOCRIT: 38.6 % (ref 36.0–46.0)
Hemoglobin: 13.1 g/dL (ref 12.0–15.0)
MCH: 31.6 pg (ref 26.0–34.0)
MCHC: 33.9 g/dL (ref 30.0–36.0)
MCV: 93.2 fL (ref 78.0–100.0)
PLATELETS: 131 10*3/uL — AB (ref 150–400)
RBC: 4.14 MIL/uL (ref 3.87–5.11)
RDW: 14.4 % (ref 11.5–15.5)
WBC: 5.9 10*3/uL (ref 4.0–10.5)

## 2013-08-16 LAB — TROPONIN I

## 2013-08-16 MED ORDER — SODIUM CHLORIDE 0.9 % IV SOLN: 250.0000 mL | INTRAVENOUS | Status: DC | PRN

## 2013-08-16 MED ORDER — SODIUM CHLORIDE 0.9 % IJ SOLN: 3.0000 mL | INTRAMUSCULAR | Status: DC | PRN

## 2013-08-16 MED ORDER — ALLOPURINOL 100 MG PO TABS
100.0000 mg | ORAL_TABLET | Freq: Every day | ORAL | Status: DC
  Filled 2013-08-16 (×3): qty 1

## 2013-08-16 MED ORDER — ONDANSETRON HCL 4 MG PO TABS: 4.0000 mg | ORAL_TABLET | Freq: Four times a day (QID) | ORAL | Status: DC | PRN

## 2013-08-16 MED ORDER — FUROSEMIDE 40 MG PO TABS
40.0000 mg | ORAL_TABLET | Freq: Two times a day (BID) | ORAL | Status: DC
  Filled 2013-08-16 (×3): qty 1

## 2013-08-16 MED ORDER — ASPIRIN 81 MG PO TABS: 81.0000 mg | ORAL_TABLET | Freq: Every day | ORAL | Status: DC

## 2013-08-16 MED ORDER — MEMANTINE HCL ER 28 MG PO CP24: 1.0000 | ORAL_CAPSULE | Freq: Every day | ORAL | Status: DC

## 2013-08-16 MED ORDER — SODIUM CHLORIDE 0.9 % IJ SOLN
3.0000 mL | Freq: Two times a day (BID) | INTRAMUSCULAR | Status: DC
  Administered 2013-08-16 – 2013-08-17 (×2): 3 mL via INTRAVENOUS

## 2013-08-16 MED ORDER — DONEPEZIL HCL 10 MG PO TABS
10.0000 mg | ORAL_TABLET | Freq: Every day | ORAL | Status: DC
  Administered 2013-08-17: 02:00:00 10 mg via ORAL
  Filled 2013-08-16 (×4): qty 1

## 2013-08-16 MED ORDER — TRAZODONE HCL 50 MG PO TABS
50.0000 mg | ORAL_TABLET | Freq: Every day | ORAL | Status: DC
  Administered 2013-08-17: 50 mg via ORAL
  Filled 2013-08-16 (×3): qty 1

## 2013-08-16 MED ORDER — ESCITALOPRAM OXALATE 10 MG PO TABS
10.0000 mg | ORAL_TABLET | Freq: Every day | ORAL | Status: DC
  Filled 2013-08-16 (×2): qty 1

## 2013-08-16 MED ORDER — POTASSIUM CHLORIDE CRYS ER 20 MEQ PO TBCR: 20.0000 meq | EXTENDED_RELEASE_TABLET | Freq: Every day | ORAL | Status: DC | PRN

## 2013-08-16 MED ORDER — HEPARIN SODIUM (PORCINE) 5000 UNIT/ML IJ SOLN
5000.0000 [IU] | Freq: Three times a day (TID) | INTRAMUSCULAR | Status: DC
  Administered 2013-08-16 – 2013-08-18 (×5): 5000 [IU] via SUBCUTANEOUS
  Filled 2013-08-16 (×8): qty 1

## 2013-08-16 MED ORDER — ONDANSETRON HCL 4 MG/2ML IJ SOLN: 4.0000 mg | Freq: Four times a day (QID) | INTRAMUSCULAR | Status: DC | PRN

## 2013-08-16 MED ORDER — BISOPROLOL FUMARATE 10 MG PO TABS
10.0000 mg | ORAL_TABLET | Freq: Every day | ORAL | Status: DC
  Filled 2013-08-16 (×3): qty 1

## 2013-08-16 MED ORDER — FAMOTIDINE 20 MG PO TABS
20.0000 mg | ORAL_TABLET | Freq: Every day | ORAL | Status: DC
Start: 2013-08-16 — End: 2013-08-18
  Filled 2013-08-16 (×2): qty 1

## 2013-08-16 MED ORDER — MEMANTINE HCL ER 28 MG PO CP24
28.0000 mg | ORAL_CAPSULE | Freq: Every day | ORAL | Status: DC
  Filled 2013-08-16 (×3): qty 28

## 2013-08-16 MED ORDER — LORAZEPAM 0.5 MG PO TABS: 0.5000 mg | ORAL_TABLET | Freq: Three times a day (TID) | ORAL | Status: DC | PRN

## 2013-08-16 MED ORDER — ASPIRIN 81 MG PO CHEW
81.0000 mg | CHEWABLE_TABLET | Freq: Once | ORAL | Status: DC
  Filled 2013-08-16: qty 1

## 2013-08-16 MED ORDER — ATORVASTATIN CALCIUM 40 MG PO TABS
40.0000 mg | ORAL_TABLET | Freq: Every day | ORAL | Status: DC
  Filled 2013-08-16 (×2): qty 1

## 2013-08-16 MED ORDER — LOSARTAN POTASSIUM 50 MG PO TABS
50.0000 mg | ORAL_TABLET | Freq: Every day | ORAL | Status: DC
  Filled 2013-08-16 (×3): qty 1

## 2013-08-16 MED ORDER — SODIUM CHLORIDE 0.9 % IJ SOLN
3.0000 mL | Freq: Two times a day (BID) | INTRAMUSCULAR | Status: DC
  Administered 2013-08-17: 3 mL via INTRAVENOUS

## 2013-08-16 NOTE — ED Notes (Signed)
Per EMS, pt here with AMS and orthostatic hypertension since today; pt 'slightly feverish' with a cough.  Per daughter, pt has been more weaker than norm; pt normally ambulatory but has not been ambulatory today. VSS.

## 2013-08-16 NOTE — ED Provider Notes (Signed)
Massa Pe S 8:00PM patient discussed in sign out. Patient with history of CHF and Alzheimer's dementia presenting with worsening cough and shortness of breath symptoms. Cardiology has seen patient in consult. They have given recommendations and would like hospital admission. Labs and tests are pending.  9:20PM spoke with [facility] hospitalist. They will see patient and admit.  Would like tele bed.  Angus Seller, PA-C 08/26/2013 2128

## 2013-08-16 NOTE — ED Notes (Signed)
Lab unable to run UA off urine specimen.  Will attempt to recollect urine.

## 2013-08-16 NOTE — Telephone Encounter (Signed)
Spoke with patient's daughter [name] [phone]). She called to report her mother is having trouble breathing, even when sitting upright. She has known LV dysfunction with chronic systolic HF. She also has "a cold or maybe the flu." Her daughter noticed her weight increasing yesterday and gave an additional dose of Lasix yesterday and again today. However, her mother has become more weak and SOB. She reports she is alert but is too weak to stand. I advised her to call [phone] so that she can be assessed immediately and brought to [facility] ED for further evaluation. Ms. [name] [name] expressed verbal understanding and agrees to activate EMS.

## 2013-08-16 NOTE — ED Provider Notes (Signed)
CSN: [id number]     Arrival date & time 08/17/2013  1709 History   First MD Initiated Contact with Patient August 17, 2013 1757     Chief Complaint  Patient presents with  . Altered Mental Status   (Consider location/radiation/quality/duration/timing/severity/associated sxs/prior Treatment) HPI LEVEL 5 CAVEAT PERTAINS DUE TO ALTERED MENTAL STATUS Pt presenting with c/o increased tiredness and shortness of breath.  She has hx of CHF and has been having cough, congestion and worsening shortness of breath.  Family spoke with cardiology who suggested increasing her lasix to 80mg  po BID- she started this last night but is still having increased work of breathing.  She is sob sitting up, worse lying flat.  This morning had temp of 100.6.  Daughter has had cold symptoms, no other specific sick contacts.  Pt denies chest pain.  There are no other associated systemic symptoms, there are no other alleviating or modifying factors.  Pt has dementia, but is more alert and interactive at baseline per family.   Past Medical History  Diagnosis Date  . Hypertension   . CAD (coronary artery disease)     non-obstructive  . Alzheimer disease     Mild dementia  . Benign positional vertigo   . Degenerative joint disease   . Dyslipidemia     Mild  . Gout   . Renal insufficiency   . CM (congenital malformation)     non-ischemic CM, EF= 25-35% (echo 6/04)  . History of hypoparathyroidism   . Pulmonary edema     s/p ventilator-dependent respiratory failure, cardiac enzymes show no distinct pattern or elevation (6/04)  . Anxiety   . CHF (congestive heart failure)    Past Surgical History  Procedure Laterality Date  . Cesarean section    . Hernia repair    . Parathyroidectomy      Status post  . Cataract extraction, bilateral      Status post   Family History  Problem Relation Age of Onset  . Sudden death Mother 41  . Stroke Father    History  Substance Use Topics  . Smoking status: Former Games developer  .  Smokeless tobacco: Never Used     Comment: remote 50 pack year smoking history  . Alcohol Use: No   OB History   Grav Para Term Preterm Abortions TAB SAB Ect Mult Living                 Review of Systems ROS reviewed and all otherwise negative except for mentioned in HPI  Allergies  Benazepril  Home Medications   No current outpatient prescriptions on file. BP 143/64  Pulse 75  Temp(Src) 99.3 F (37.4 C) (Axillary)  Resp 18  Ht 5\' 2"  (1.575 m)  Wt 164 lb 14.5 oz (74.8 kg)  BMI 30.15 kg/m2  SpO2 95% Vitals reviewed Physical Exam Physical Examination: General appearance - alert, well appearing, and in no distress, pt answering in one word answers Mental status - alert, oriented to person, place, and time Eyes - pupils equal round and reactive, no conjunctival injection or scleral icterus Mouth - mucous membranes moist, pharynx normal without lesions Chest - clear to auscultation, no wheezes, rales or rhonchi, symmetric air entry, tachypneic Heart - normal rate, regular rhythm, normal S1, S2, no murmurs, rubs, clicks or gallops Abdomen - soft, nontender, nondistended, no masses or organomegaly Extremities - peripheral pulses normal, no pedal edema, no clubbing or cyanosis Neurology- awake, alert, answering in one word answers, moving all extremities, unable to  assess orientation.  Skin - normal coloration and turgor, no rashes  ED Course  Procedures (including critical care time)   Date: 08/20/2013  Rate: 102  Rhythm: sinus tachycardia  QRS Axis: left  Intervals: normal  ST/T Wave abnormalities: nonspecific ST changes  Conduction Disutrbances:left bundle branch block  Narrative Interpretation:   Old EKG Reviewed: no significant changes  EKG link not opening in MUSE for interpretation there.   7:00 PM d/w cardiology on call, they will see patient in the ED.   8:32 PM cardiology has seen patient, they recommend medical admission.  They are scheduling an echo for  tomorrow AM.    Labs Review Labs Reviewed  CBC - Abnormal; Notable for the following:    Platelets 131 (*)    All other components within normal limits  COMPREHENSIVE METABOLIC PANEL - Abnormal; Notable for the following:    Glucose, Bld 116 (*)    BUN 24 (*)    Creatinine, Ser 1.63 (*)    GFR calc non Af Amer 26 (*)    GFR calc Af Amer 31 (*)    All other components within normal limits  PRO B NATRIURETIC PEPTIDE - Abnormal; Notable for the following:    Pro B Natriuretic peptide (BNP) 10920.0 (*)    All other components within normal limits  INFLUENZA PANEL BY PCR - Abnormal; Notable for the following:    Influenza A By PCR POSITIVE (*)    All other components within normal limits  BASIC METABOLIC PANEL - Abnormal; Notable for the following:    Glucose, Bld 126 (*)    BUN 25 (*)    Creatinine, Ser 1.56 (*)    GFR calc non Af Amer 28 (*)    GFR calc Af Amer 32 (*)    All other components within normal limits  CBC - Abnormal; Notable for the following:    WBC 10.7 (*)    Platelets 133 (*)    All other components within normal limits  URINALYSIS, ROUTINE W REFLEX MICROSCOPIC - Abnormal; Notable for the following:    APPearance CLOUDY (*)    Hgb urine dipstick SMALL (*)    Protein, ur 100 (*)    All other components within normal limits  URINE MICROSCOPIC-ADD ON - Abnormal; Notable for the following:    Bacteria, UA FEW (*)    Casts GRANULAR CAST (*)    All other components within normal limits  GLUCOSE, CAPILLARY - Abnormal; Notable for the following:    Glucose-Capillary 149 (*)    All other components within normal limits  GLUCOSE, CAPILLARY - Abnormal; Notable for the following:    Glucose-Capillary 146 (*)    All other components within normal limits  GLUCOSE, CAPILLARY - Abnormal; Notable for the following:    Glucose-Capillary 203 (*)    All other components within normal limits  CBC - Abnormal; Notable for the following:    WBC 10.7 (*)    Hemoglobin 15.3 (*)     Platelets 112 (*)    All other components within normal limits  BASIC METABOLIC PANEL - Abnormal; Notable for the following:    Glucose, Bld 113 (*)    BUN 53 (*)    Creatinine, Ser 4.02 (*)    GFR calc non Af Amer 9 (*)    GFR calc Af Amer 10 (*)    All other components within normal limits  GLUCOSE, CAPILLARY - Abnormal; Notable for the following:    Glucose-Capillary 136 (*)  All other components within normal limits  URINE CULTURE  TROPONIN I  TSH   Imaging Review Dg Chest Port 1 View  08/17/2013   CLINICAL DATA:  Respiratory distress.  EXAM: PORTABLE CHEST - 1 VIEW  COMPARISON:  None.  FINDINGS: And cardiac enlargement is stable. Atherosclerotic calcifications are again noted at the aortic arch. There is now bilateral interstitial edema and small bilateral pleural effusions. Bibasilar airspace disease is evident.  IMPRESSION: 1. Cardiomegaly with new interstitial edema and bilateral pleural effusions compatible with congestive heart failure. 2. Bibasilar airspace disease. This is more prominent than expected for atelectasis. Infection is not excluded.   Electronically Signed   By: Gennette Pachris  Mattern M.D.   On: 08/17/2013 09:06   Dg Chest Port 1 View  09/08/2013   CLINICAL DATA:  Shortness of breath, cough, congestion  EXAM: PORTABLE CHEST - 1 VIEW  COMPARISON:  None.  FINDINGS: Moderate to severe cardiac enlargement. Vascular pattern normal. Aortic arch shows calcification. No consolidation or effusion identified.  IMPRESSION: Cardiac enlargement. Lungs appear clear. Retrocardiac portion of left lower lobe not well evaluated.   Electronically Signed   By: Esperanza Heiraymond  Rubner M.D.   On: 2013/11/20 19:10    EKG Interpretation    Date/Time:    Ventricular Rate:    PR Interval:    QRS Duration:   QT Interval:    QTC Calculation:   R Axis:     Text Interpretation:              MDM   1. Shortness of breath   2. Chronic systolic heart failure   3. Acute respiratory failure    4. Anxiety state, unspecified   5. Influenza   6. CHF (congestive heart failure)   7. Influenza due to identified novel influenza A virus with other respiratory manifestations   8. Palliative care encounter    Pt presenting with cough, increased fatigue, shortness of breath.  CXR shows no evidence of fluid overload.  She has been taking double her lasix dose since last night without much improvement in her breathing.  Labs including BNP, troponin, influenza ordered and pending.  Cardiology has seen patient and recommends medical admission. Pt signed out to oncoming provider pending f/u labs and admission to hospitalist.  Cardiology consult note on chart.      Ethelda ChickMartha K Linker, MD 09/13/2013 [phone]

## 2013-08-16 NOTE — Consult Note (Signed)
CARDIOLOGY CONSULT NOTE  [name] ID: [name] [name] MRN: [id number], DOB/AGE: [birth year]   Date of Consult: 2013-09-13  Primary Physician: Sanda Linger, MD Primary Cardiologist: Antoine Poche, MD Reason for Consultation: CHF  History of Present Illness [name] [name] is a 80 year old woman with a nonischemic cardiomyopathy, chronic systolic CHF, CKD stage III, HTN, HL, Alzheimer's dementia. [name] [name] presents to [name] ED with increasing SOB, cough and altered mental status. [name] [name] daughter provides [name] [name] history and is [name] [name] healthcare POA. [name] [name] has had progressive SOB and weakness over [name] last 2-3 days. [name] [name] daughter noticed [name] [name] weight was increasing as well and [name] [name] gave an additional dose of Lasix yesterday and today but without improvement. In addition, [name] [name] is too weak to stand which is unusual for [name] [name]. [name] [name] has not complained of chest pain. [name] [name] has not had syncope.   Last heart cath (02/2003): Mid left main 20%, proximal-mid RCA 30-40%. Echocardiogram (01/2003): EF 25-35%, apical and anterior HK, septal HK-AK, mild AI, mild ascending aortic dilatation, MAC, LA upper limits of normal.   Past Medical History Past Medical History  Diagnosis Date  . Hypertension   . CAD (coronary artery disease)     non-obstructive  . Alzheimer disease     Mild dementia  . Benign positional vertigo   . Degenerative joint disease   . Dyslipidemia     Mild  . Gout   . Renal insufficiency   . CM (congenital malformation)     non-ischemic CM, EF= 25-35% (echo 6/04)  . History of hypoparathyroidism   . Pulmonary edema     s/p ventilator-dependent respiratory failure, cardiac enzymes show no distinct pattern or elevation (6/04)  . Anxiety     Past Surgical History Past Surgical History  Procedure Laterality Date  . Cesarean section    . Hernia repair    . Parathyroidectomy      Status post  . Cataract extraction, bilateral      Status post     Allergies/Intolerances Allergies  Allergen Reactions  . Benazepril    cough    Current Home Medications No current facility-administered medications for this encounter.   Current Outpatient Prescriptions  Medication Sig Dispense Refill  . allopurinol (ZYLOPRIM) 100 MG tablet Take 100 mg by mouth daily.      Marland Kitchen aspirin 81 MG tablet Take 1 tablet (81 mg total) by mouth daily.  90 tablet  3  . bisoprolol (ZEBETA) 10 MG tablet Take 10 mg by mouth daily.      Marland Kitchen donepezil (ARICEPT) 10 MG tablet Take 1 tablet (10 mg total) by mouth at bedtime.  90 tablet  3  . escitalopram (LEXAPRO) 10 MG tablet Take 10 mg by mouth daily.      . furosemide (LASIX) 40 MG tablet Take 40 mg by mouth 2 (two) times daily.      Marland Kitchen LORazepam (ATIVAN) 0.5 MG tablet Take 1 tablet (0.5 mg total) by mouth every 8 (eight) hours as needed for anxiety.  60 tablet  4  . losartan (COZAAR) 50 MG tablet Take 50 mg by mouth daily.      . Memantine HCl ER (NAMENDA XR) 28 MG CP24 Take 28 mg by mouth daily.  90 capsule  3  . Multiple Vitamin (MULTIVITAMIN) tablet Take 1 tablet by mouth daily.        . potassium chloride SA (K-DUR,KLOR-CON) 20 MEQ tablet Take 20 mEq by mouth daily as needed (when Lasix is 80mg ).      . ranitidine (  ZANTAC) 75 MG tablet Take 75 mg by mouth daily.      . simvastatin (ZOCOR) 80 MG tablet Take 1 tablet (80 mg total) by mouth at bedtime.  90 tablet  3  . traZODone (DESYREL) 50 MG tablet Take 1 tablet (50 mg total) by mouth at bedtime.  90 tablet  3    Family History Family History  Problem Relation Age of Onset  . Sudden death [name] [name] 12  . Stroke Father      Social History History   Social History  . Marital Status: Married    Spouse Name: N/A    Number of Children: N/A  . Years of Education: N/A   Occupational History  . Retired    Social History Main Topics  . Smoking status: Former Games developer  . Smokeless tobacco: Never Used     Comment: remote 50 pack year smoking history  . Alcohol Use: No  . Drug Use: No  . Sexual Activity: Not Currently   Other Topics  Concern  . Not on file   Social History Narrative   Lives alone   Remote 50pk yr smoking hx     Review of Systems General:  No chills, fever, night sweats or weight changes Cardiovascular:  No chest pain, dyspnea on exertion, edema, orthopnea, palpitations, paroxysmal nocturnal dyspnea Dermatological: No rash, lesions or masses Respiratory: No cough, dyspnea Urologic: No hematuria, dysuria Abdominal:   No nausea, vomiting, diarrhea, bright red blood per rectum, melena, or hematemesis Neurologic:  No visual changes, weakness, changes in mental status All other systems reviewed and are otherwise negative except as noted above.  Physical Exam Blood pressure 130/64, pulse 103, temperature 98.5 F (36.9 C), temperature source Oral, resp. rate 28, SpO2 97.00%.  General: Well developed 80 year old female in no acute distress. HEENT: Normocephalic, atraumatic. EOMs intact. Sclera nonicteric. Oropharynx clear.  Neck: Supple. No JVD. Lungs: Respirations regular and unlabored, CTA bilaterally. No wheezes, rales or rhonchi. Heart: Regular. S1, S2 present. No murmurs, rub, S3 or S4. Abdomen: Soft, non-distended.  Extremities: No clubbing, cyanosis or edema.  Neuro: Alert. Moves all extremities spontaneously.   Diagnostics Portable chest x-ray FINDINGS: Moderate to severe cardiac enlargement. Vascular pattern normal. Aortic arch shows calcification. No consolidation or effusion identified. IMPRESSION: Cardiac enlargement. Lungs appear clear. Retrocardiac portion of left lower lobe not well evaluated.  12-lead ECG on arrival - sinus tachycardia at 101 bpm with first degree AV block and LBBB (old) 12-lead ECG from 08/07/2013 - sinus rhythm at 57 bpm with long first degree AV block (PR 400) and LBBB  Assessment and Plan 1. NICM with chronic systolic HF - does not appear volume overloaded at this time - check echo - continue medical therapy - daughter, healthcare POA, considering  placement for [name] [name] with advancing dementia; recommend case management consult  2. AMS / weakness 3. Recent cold symptoms / cough 4. Alzheimers dementia 5. CKD  Dr. Eden Emms was in to interview and examine [name] [name]. Please see recommendations below. Signed, Rick Duff, PA-C 09/02/2013, 7:10 PM  [name] examined Chart reviewed. Discussed plan of care with family.  Weakness and cough seem more consistent with over diuresis and flu.  [name] [name] is very debilitated with advanced Altzheimers  Daughter who is health care power of attorney was looking into placement.  [name] [name] concurs with DNR status.  For now would check CXR, BNP and echo.  Consult social services in am.  Check for flu Not a candidate for advanced inotropic Rx  ECG with chronic LBBB.  Exam with nonproductive cough and rhonchi no murmur.  Does not appear to be volume overloaded with no real LE edema.    Charlton HawsPeter Edwardine Deschepper

## 2013-08-16 NOTE — H&P (Signed)
[facility] History and Physical  [name] [name] ZOX:[id number] DOB: 02/06/1920 DOA: 09/05/2013  Referring physician: ED physician PCP: Sanda Lingerhomas Jones, MD   Chief Complaint: shortness of breath   HPI:  Pt is 80 yo female with a nonischemic cardiomyopathy, chronic systolic CHF, CKD stage III, HTN, HL, Alzheimer's dementia who [facility] with main concern of progressively worsening shortness of breath associated with productive cough of clear sputum and altered mental status that was first noted by her daughter who is HCPOA, 2-3 days prior to this admission. Per daughter, pt noted to have slight increase in weight and daughter gave extra dose of Lasix with no specific improvement in symptoms. There is no reported chest pain, no specific abdominal or urinary concerns, no specific focal neurological symptoms.   Last heart cath (02/2003): Mid left main 20%, proximal-mid RCA 30-40%. Echocardiogram (01/2003): EF 25-35%, apical and anterior HK, septal HK-AK, mild AI, mild ascending aortic dilatation, MAC, LA upper limits of normal.   In ED, pt hemodynamically stable. Rhonchi noted on exam, cardiology consulted by ED doctor. Telemetry bed requested.   Assessment and Plan:  Principal Problem:   Acute respiratory failure - unclear etiology at this time - appreciate cardio input - no clear sign of volume overload - will follow up on influenza panel - admit to telemetry floor - continue home regimen with Lasix, 2 D ECHO ordered  Active Problems:   CHF (congestive heart failure) - systolic - 2 D ECHO ordered - daily weights, I's and O's   Essential hypertension, benign - continue home medical regimen but may need to hold Losartan if renal failure worsening    CKD (chronic kidney disease) stage 3, GFR 30-59 ml/min - close monitoring of renal function as pt on Lasix  - BMP in AM   Functional quadriplegia - secondary to progressive dementia - SW consult for placement   Code Status:  DNI/DNR Family Communication: Pt at bedside Disposition Plan: Admit to telemetry bed   Review of Systems:  Constitutional: Negative for diaphoresis.  HENT: Negative for hearing loss, ear pain, nosebleeds, congestion, sore throat, neck pain, tinnitus and ear discharge.   Eyes: Negative for blurred vision, double vision, photophobia, pain, discharge and redness.  Respiratory: Negative for wheezing and stridor.   Cardiovascular: Negative for  palpitations, orthopnea, claudication and leg swelling.  Gastrointestinal: Negative for heartburn, constipation, blood in stool and melena.  Genitourinary: Negative for dysuria, urgency, frequency, hematuria and flank pain.  Musculoskeletal: Negative for myalgias, back pain, joint pain and falls.  Skin: Negative for itching and rash.  Neurological: Negative for tingling, tremors, sensory change, speech change, focal weakness, loss of consciousness and headaches.  Endo/Heme/Allergies: Negative for environmental allergies and polydipsia. Does not bruise/bleed easily.  Psychiatric/Behavioral: Negative for suicidal ideas. The patient is not nervous/anxious.      Past Medical History  Diagnosis Date  . Hypertension   . CAD (coronary artery disease)     non-obstructive  . Alzheimer disease     Mild dementia  . Benign positional vertigo   . Degenerative joint disease   . Dyslipidemia     Mild  . Gout   . Renal insufficiency   . CM (congenital malformation)     non-ischemic CM, EF= 25-35% (echo 6/04)  . History of hypoparathyroidism   . Pulmonary edema     s/p ventilator-dependent respiratory failure, cardiac enzymes show no distinct pattern or elevation (6/04)  . Anxiety     Past Surgical History  Procedure Laterality  Date  . Cesarean section    . Hernia repair    . Parathyroidectomy      Status post  . Cataract extraction, bilateral      Status post    Social History:  reports that she has quit smoking. She has never used smokeless  tobacco. She reports that she does not drink alcohol or use illicit drugs.  Allergies  Allergen Reactions  . Benazepril     cough    Family History  Problem Relation Age of Onset  . Sudden death Mother 16  . Stroke Father     Prior to Admission medications   Medication Sig Start Date End Date Taking? Authorizing Provider  allopurinol (ZYLOPRIM) 100 MG tablet Take 100 mg by mouth daily.   Yes Historical Provider, MD  aspirin 81 MG tablet Take 1 tablet (81 mg total) by mouth daily. 05/31/13  Yes Etta Grandchild, MD  bisoprolol (ZEBETA) 10 MG tablet Take 10 mg by mouth daily.   Yes Historical Provider, MD  donepezil (ARICEPT) 10 MG tablet Take 1 tablet (10 mg total) by mouth at bedtime. 05/31/13  Yes Etta Grandchild, MD  escitalopram (LEXAPRO) 10 MG tablet Take 10 mg by mouth daily.   Yes Historical Provider, MD  furosemide (LASIX) 40 MG tablet Take 40 mg by mouth 2 (two) times daily.   Yes Historical Provider, MD  LORazepam (ATIVAN) 0.5 MG tablet Take 1 tablet (0.5 mg total) by mouth every 8 (eight) hours as needed for anxiety. 05/31/13  Yes Etta Grandchild, MD  losartan (COZAAR) 50 MG tablet Take 50 mg by mouth daily.   Yes Historical Provider, MD  Memantine HCl ER (NAMENDA XR) 28 MG CP24 Take 28 mg by mouth daily. 05/31/13  Yes Etta Grandchild, MD  Multiple Vitamin (MULTIVITAMIN) tablet Take 1 tablet by mouth daily.     Yes Historical Provider, MD  potassium chloride SA (K-DUR,KLOR-CON) 20 MEQ tablet Take 20 mEq by mouth daily as needed (when Lasix is 80mg ).   Yes Historical Provider, MD  ranitidine (ZANTAC) 75 MG tablet Take 75 mg by mouth daily.   Yes Historical Provider, MD  simvastatin (ZOCOR) 80 MG tablet Take 1 tablet (80 mg total) by mouth at bedtime. 05/31/13  Yes Etta Grandchild, MD  traZODone (DESYREL) 50 MG tablet Take 1 tablet (50 mg total) by mouth at bedtime. 05/31/13  Yes Etta Grandchild, MD    Physical Exam: Filed Vitals:   2013-08-20 1824  BP: 130/64  Pulse: 103  Temp:  98.5 F (36.9 C)  TempSrc: Oral  Resp: 28  SpO2: 97%    Physical Exam  Constitutional: Appears well-developed and well-nourished. No distress.  HENT: Normocephalic. External right and left ear normal. Dry MM Eyes: Conjunctivae and EOM are normal. PERRLA, no scleral icterus.  Neck: Normal ROM. Neck supple. No JVD. No tracheal deviation. No thyromegaly.  CVS: RRR, S1/S2 +, no murmurs, no gallops, no carotid bruit.  Pulmonary: rhonchi noted bilaterally but no wheezing and no respiratory distress  Abdominal: Soft. BS +,  no distension, tenderness, rebound or guarding.  Musculoskeletal: Normal range of motion. No edema and no tenderness.  Lymphadenopathy: No lymphadenopathy noted, cervical, inguinal. Neuro: Alert. Confused, follows some commands Skin: Skin is warm and dry. No rash noted. Not diaphoretic. No erythema. No pallor.  Psychiatric: Normal mood and affect. Behavior, judgment, thought content normal.   Labs on Admission:  Basic Metabolic Panel:  Recent Labs Lab 08/20/2013 1934  NA 140  K 4.4  CL 99  CO2 28  GLUCOSE 116*  BUN 24*  CREATININE 1.63*  CALCIUM 9.2   Liver Function Tests:  Recent Labs Lab 08-26-13 1934  AST 30  ALT 16  ALKPHOS 44  BILITOT 0.6  PROT 7.4  ALBUMIN 3.7   CBC:  Recent Labs Lab 2013/08/26 1934  WBC 5.9  HGB 13.1  HCT 38.6  MCV 93.2  PLT 131*   Cardiac Enzymes:  Recent Labs Lab 08-26-13 1934  TROPONINI <0.30    Radiological Exams on Admission: Dg Chest Port 1 View   08/26/2013   Cardiac enlargement. Lungs appear clear. Retrocardiac portion of left lower lobe not well evaluated.   EKG: Normal sinus rhythm, no ST/T wave changes  Debbora Presto, MD  [facility] Pager [phone]  If 7PM-7AM, please contact night-coverage [email or web address] Password [facility] 2013/08/26, 9:29 PM

## 2013-08-17 ENCOUNTER — Observation Stay (HOSPITAL_COMMUNITY): Payer: Medicare Other

## 2013-08-17 DIAGNOSIS — J09X2 Influenza due to identified novel influenza A virus with other respiratory manifestations: Secondary | ICD-10-CM

## 2013-08-17 DIAGNOSIS — J9600 Acute respiratory failure, unspecified whether with hypoxia or hypercapnia: Secondary | ICD-10-CM

## 2013-08-17 DIAGNOSIS — Z515 Encounter for palliative care: Secondary | ICD-10-CM

## 2013-08-17 DIAGNOSIS — I359 Nonrheumatic aortic valve disorder, unspecified: Secondary | ICD-10-CM

## 2013-08-17 DIAGNOSIS — J111 Influenza due to unidentified influenza virus with other respiratory manifestations: Secondary | ICD-10-CM

## 2013-08-17 DIAGNOSIS — I509 Heart failure, unspecified: Secondary | ICD-10-CM

## 2013-08-17 LAB — CBC
HCT: 43 % (ref 36.0–46.0)
Hemoglobin: 14.7 g/dL (ref 12.0–15.0)
MCH: 32.7 pg (ref 26.0–34.0)
MCHC: 34.2 g/dL (ref 30.0–36.0)
MCV: 95.6 fL (ref 78.0–100.0)
Platelets: 133 10*3/uL — ABNORMAL LOW (ref 150–400)
RBC: 4.5 MIL/uL (ref 3.87–5.11)
RDW: 14.6 % (ref 11.5–15.5)
WBC: 10.7 10*3/uL — ABNORMAL HIGH (ref 4.0–10.5)

## 2013-08-17 LAB — URINALYSIS, ROUTINE W REFLEX MICROSCOPIC
Bilirubin Urine: NEGATIVE
Glucose, UA: NEGATIVE mg/dL
KETONES UR: NEGATIVE mg/dL
Leukocytes, UA: NEGATIVE
NITRITE: NEGATIVE
PROTEIN: 100 mg/dL — AB
Specific Gravity, Urine: 1.016 (ref 1.005–1.030)
UROBILINOGEN UA: 1 mg/dL (ref 0.0–1.0)
pH: 5 (ref 5.0–8.0)

## 2013-08-17 LAB — INFLUENZA PANEL BY PCR (TYPE A & B)
H1N1 flu by pcr: NOT DETECTED
Influenza A By PCR: POSITIVE — AB
Influenza B By PCR: NEGATIVE

## 2013-08-17 LAB — GLUCOSE, CAPILLARY
GLUCOSE-CAPILLARY: 146 mg/dL — AB (ref 70–99)
Glucose-Capillary: 149 mg/dL — ABNORMAL HIGH (ref 70–99)
Glucose-Capillary: 203 mg/dL — ABNORMAL HIGH (ref 70–99)
Glucose-Capillary: 136 mg/dL — ABNORMAL HIGH (ref 70–99)

## 2013-08-17 LAB — URINE MICROSCOPIC-ADD ON

## 2013-08-17 LAB — BASIC METABOLIC PANEL
BUN: 25 mg/dL — ABNORMAL HIGH (ref 6–23)
CALCIUM: 9.2 mg/dL (ref 8.4–10.5)
CO2: 29 mEq/L (ref 19–32)
CREATININE: 1.56 mg/dL — AB (ref 0.50–1.10)
Chloride: 100 mEq/L (ref 96–112)
GFR calc Af Amer: 32 mL/min — ABNORMAL LOW (ref >90)
GFR calc non Af Amer: 28 mL/min — ABNORMAL LOW (ref >90)
GLUCOSE: 126 mg/dL — AB (ref 70–99)
Potassium: 4.1 mEq/L (ref 3.7–5.3)
SODIUM: 144 meq/L (ref 137–147)

## 2013-08-17 LAB — TSH: TSH: 2.337 u[IU]/mL (ref 0.350–4.500)

## 2013-08-17 MED ORDER — PIPERACILLIN-TAZOBACTAM 4.5 G IVPB
4.5000 g | INTRAVENOUS | Status: AC
  Administered 2013-08-17: 4.5 g via INTRAVENOUS
  Filled 2013-08-17: qty 100

## 2013-08-17 MED ORDER — FUROSEMIDE 10 MG/ML IJ SOLN
INTRAMUSCULAR | Status: AC
  Filled 2013-08-17: qty 8

## 2013-08-17 MED ORDER — DEXTROSE 5 % IV SOLN
1.0000 g | INTRAVENOUS | Status: DC
  Administered 2013-08-17 – 2013-08-18 (×2): 1 g via INTRAVENOUS
  Filled 2013-08-17 (×4): qty 10

## 2013-08-17 MED ORDER — OSELTAMIVIR PHOSPHATE 30 MG PO CAPS
30.0000 mg | ORAL_CAPSULE | Freq: Every day | ORAL | Status: DC
  Filled 2013-08-17 (×2): qty 1

## 2013-08-17 MED ORDER — FUROSEMIDE 10 MG/ML IJ SOLN: 40.0000 mg | Freq: Once | INTRAMUSCULAR | Status: DC

## 2013-08-17 MED ORDER — MORPHINE SULFATE 2 MG/ML IJ SOLN
INTRAMUSCULAR | Status: AC
  Administered 2013-08-17: 1 mg via INTRAMUSCULAR
  Filled 2013-08-17: qty 1

## 2013-08-17 MED ORDER — MORPHINE SULFATE 2 MG/ML IJ SOLN
1.0000 mg | INTRAMUSCULAR | Status: DC | PRN
  Administered 2013-08-17 – 2013-08-18 (×3): 1 mg via INTRAVENOUS
  Filled 2013-08-17 (×3): qty 1

## 2013-08-17 MED ORDER — ATROPINE SULFATE 1 % OP SOLN
2.0000 [drp] | Freq: Three times a day (TID) | OPHTHALMIC | Status: DC
  Administered 2013-08-17 – 2013-08-18 (×2): 2 [drp] via SUBLINGUAL
  Filled 2013-08-17: qty 2

## 2013-08-17 MED ORDER — FUROSEMIDE 10 MG/ML IJ SOLN
80.0000 mg | Freq: Once | INTRAMUSCULAR | Status: AC
  Administered 2013-08-17: 09:00:00 80 mg via INTRAVENOUS

## 2013-08-17 MED ORDER — VANCOMYCIN HCL IN DEXTROSE 1-5 GM/200ML-% IV SOLN
1000.0000 mg | INTRAVENOUS | Status: AC
  Administered 2013-08-17: 1000 mg via INTRAVENOUS
  Filled 2013-08-17: qty 200

## 2013-08-17 MED ORDER — FUROSEMIDE 10 MG/ML IJ SOLN
40.0000 mg | Freq: Once | INTRAMUSCULAR | Status: AC
  Administered 2013-08-17: 40 mg via INTRAVENOUS
  Filled 2013-08-17: qty 4

## 2013-08-17 MED ORDER — FUROSEMIDE 10 MG/ML IJ SOLN
40.0000 mg | Freq: Two times a day (BID) | INTRAMUSCULAR | Status: DC
  Administered 2013-08-17: 18:00:00 40 mg via INTRAVENOUS
  Filled 2013-08-17 (×3): qty 4

## 2013-08-17 MED ORDER — AZITHROMYCIN 500 MG IV SOLR
500.0000 mg | INTRAVENOUS | Status: DC
  Administered 2013-08-17: 15:00:00 500 mg via INTRAVENOUS
  Filled 2013-08-17 (×2): qty 500

## 2013-08-17 MED ORDER — ALBUTEROL SULFATE (2.5 MG/3ML) 0.083% IN NEBU
2.5000 mg | INHALATION_SOLUTION | RESPIRATORY_TRACT | Status: DC | PRN
  Administered 2013-08-17: 2.5 mg via RESPIRATORY_TRACT
  Filled 2013-08-17: qty 3

## 2013-08-17 MED ORDER — ALBUTEROL SULFATE (2.5 MG/3ML) 0.083% IN NEBU
INHALATION_SOLUTION | RESPIRATORY_TRACT | Status: AC
Start: 2013-08-17 — End: 2013-08-17
  Administered 2013-08-17: 2.5 mg
  Filled 2013-08-17: qty 3

## 2013-08-17 NOTE — Progress Notes (Signed)
  Echocardiogram 2D Echocardiogram has been performed.  [name] [name] 08/17/2013, 3:18 PM

## 2013-08-17 NOTE — Progress Notes (Addendum)
SUBJECTIVE:  Progressive respiratory decline.  Treated with doses of IV Lasix.  ProBNP is elevated.    PHYSICAL EXAM Filed Vitals:   10/28/2013 2154 10/28/2013 2246 08/17/13 0433 08/17/13 0459  BP: 141/60 145/70 166/93   Pulse: 85 91 128   Temp: 98.4 F (36.9 C) 97.3 F (36.3 C) 100.6 F (38.1 C)   TempSrc: Oral Oral Axillary   Resp: 18 21 25    Height:  5\' 2"  (1.575 m)    Weight:  163 lb 5.8 oz (74.1 kg)    SpO2: 98% 100% 93% 95%   General:  Minimally responsive Lungs:  Transmitted upper airway sounds Heart:  Tachy Abdomen:  Positive bowel sounds, no rebound no guarding Extremities:  No edema  LABS: Lab Results  Component Value Date   TROPONINI <0.30 09/09/2013   Results for orders placed during the hospital encounter of 10/28/2013 (from the past 24 hour(s))  CBC     Status: Abnormal   Collection Time    10/28/2013  7:34 PM      Result Value Range   WBC 5.9  4.0 - 10.5 K/uL   RBC 4.14  3.87 - 5.11 MIL/uL   Hemoglobin 13.1  12.0 - 15.0 g/dL   HCT 16.138.6  09.636.0 - 04.546.0 %   MCV 93.2  78.0 - 100.0 fL   MCH 31.6  26.0 - 34.0 pg   MCHC 33.9  30.0 - 36.0 g/dL   RDW 40.914.4  81.111.5 - 91.415.5 %   Platelets 131 (*) 150 - 400 K/uL  COMPREHENSIVE METABOLIC PANEL     Status: Abnormal   Collection Time    10/28/2013  7:34 PM      Result Value Range   Sodium 140  137 - 147 mEq/L   Potassium 4.4  3.7 - 5.3 mEq/L   Chloride 99  96 - 112 mEq/L   CO2 28  19 - 32 mEq/L   Glucose, Bld 116 (*) 70 - 99 mg/dL   BUN 24 (*) 6 - 23 mg/dL   Creatinine, Ser 7.821.63 (*) 0.50 - 1.10 mg/dL   Calcium 9.2  8.4 - 95.610.5 mg/dL   Total Protein 7.4  6.0 - 8.3 g/dL   Albumin 3.7  3.5 - 5.2 g/dL   AST 30  0 - 37 U/L   ALT 16  0 - 35 U/L   Alkaline Phosphatase 44  39 - 117 U/L   Total Bilirubin 0.6  0.3 - 1.2 mg/dL   GFR calc non Af Amer 26 (*) >90 mL/min   GFR calc Af Amer 31 (*) >90 mL/min  PRO B NATRIURETIC PEPTIDE     Status: Abnormal   Collection Time    10/28/2013  7:34 PM      Result Value Range   Pro B  Natriuretic peptide (BNP) 10920.0 (*) 0 - 450 pg/mL  TROPONIN I     Status: None   Collection Time    10/28/2013  7:34 PM      Result Value Range   Troponin I <0.30  <0.30 ng/mL  BASIC METABOLIC PANEL     Status: Abnormal   Collection Time    08/17/13  4:21 AM      Result Value Range   Sodium 144  137 - 147 mEq/L   Potassium 4.1  3.7 - 5.3 mEq/L   Chloride 100  96 - 112 mEq/L   CO2 29  19 - 32 mEq/L   Glucose, Bld 126 (*) 70 - 99  mg/dL   BUN 25 (*) 6 - 23 mg/dL   Creatinine, Ser 0.03 (*) 0.50 - 1.10 mg/dL   Calcium 9.2  8.4 - 49.1 mg/dL   GFR calc non Af Amer 28 (*) >90 mL/min   GFR calc Af Amer 32 (*) >90 mL/min  CBC     Status: Abnormal   Collection Time    08/17/13  4:21 AM      Result Value Range   WBC 10.7 (*) 4.0 - 10.5 K/uL   RBC 4.50  3.87 - 5.11 MIL/uL   Hemoglobin 14.7  12.0 - 15.0 g/dL   HCT 79.1  50.5 - 69.7 %   MCV 95.6  78.0 - 100.0 fL   MCH 32.7  26.0 - 34.0 pg   MCHC 34.2  30.0 - 36.0 g/dL   RDW 94.8  01.6 - 55.3 %   Platelets 133 (*) 150 - 400 K/uL   No intake or output data in the 24 hours ending 08/17/13 0813   ASSESSMENT AND PLAN:  CHRONIC SYSTOLIC HF:  I suspect that her decline in respiratory status is in part related to volume with the elevated pro BNP.  However, our options are limited.  I suspect primarily an infectious etiology with her fever.  Given her severe comorbidities.  I would suggest comfort care.    I had a long discussion with the patient's daughter.  She is not in the hospital yet.  However, it is clear that she is not comfortable with the DNR status at this point.  She apparently made her mother a DNR last night but she is now surprised that her condition has deteriorated.  She will reconsider whether she wants her to be intubated and she is driving to the hospital now. (Greater than 40 minutes reviewing all data with greater than 50% face to face with the patient).  Fayrene Fearing [facility] 08/17/2013 8:13 AM

## 2013-08-17 NOTE — Progress Notes (Signed)
PROGRESS NOTE  [name] [name] WUJ:[id number] DOB: 11/26/1919 DOA: 09/01/2013 PCP: Sanda Lingerhomas Jones, MD  Assessment/Plan: Acute respiratory failure - multifactorial CHF, ?PNA - worsening respiratory status this morning; patient admitted as DNR/DNI but when family informed over the phone regarding decline they wanted to rethink code status. PCCM was consulted then and after family discussions decided to continue DNR/DNI but full course of medical treatment with antibiotics ans supportive treatment.  - throughout the day, family with concerns and persistent in wanting to feed patient. I recommend that she remains NPO given respiratory status.  CHF - advanced, cardiology consulted HTN CKD Functional quadriplegia  Alzheimer dementia   Diet: NPO Fluids: none DVT Prophylaxis: heparin  Code Status: DNR Family Communication: daughter  Disposition Plan: inpatient  Consultants:  PCCM  Cardiology  Palliative  Procedures:  2D echo Study Conclusions - Left ventricle: There is diffuse hypokinesis with akinesis of the basal and mid inferior and inferoseptall walls, and apical inferior wall. The cavity size was normal. There was mild concentric hypertrophy. Systolic function was severely reduced. The estimated ejection fraction was in the range of 20% to 25%. Wall motion was normal; there were no regional wall motion abnormalities. The study is not technically sufficient to allow evaluation of LV diastolic function. Doppler parameters are consistent with elevated ventricular end-diastolic filling pressure.   Antibiotics  Anti-infectives   Start     Dose/Rate Route Frequency Ordered Stop   08/17/13 0830  vancomycin (VANCOCIN) IVPB 1000 mg/200 mL premix     1,000 mg 200 mL/hr over 60 Minutes Intravenous STAT 08/17/13 0823 08/17/13 1004   08/17/13 0830  piperacillin-tazobactam (ZOSYN) IVPB 4.5 g     4.5 g 200 mL/hr over 30 Minutes Intravenous STAT 08/17/13 0823 11/12/2013 0830   08/17/13 0500   cefTRIAXone (ROCEPHIN) 1 g in dextrose 5 % 50 mL IVPB     1 g 100 mL/hr over 30 Minutes Intravenous Every 24 hours 08/17/13 0438       Antibiotics Given (last 72 hours)   Date/Time Action Medication Dose Rate   08/17/13 0724 Given   cefTRIAXone (ROCEPHIN) 1 g in dextrose 5 % 50 mL IVPB 1 g 100 mL/hr   08/17/13 0904 Given   vancomycin (VANCOCIN) IVPB 1000 mg/200 mL premix 1,000 mg 200 mL/hr     HPI/Subjective: - little response, opens eyes but does not follow commands  Objective: Filed Vitals:   08/20/2013 2154 09/12/2013 2246 08/17/13 0433 08/17/13 0459  BP: 141/60 145/70 166/93   Pulse: 85 91 128   Temp: 98.4 F (36.9 C) 97.3 F (36.3 C) 100.6 F (38.1 C)   TempSrc: Oral Oral Axillary   Resp: 18 21 25    Height:  5\' 2"  (1.575 m)    Weight:  74.1 kg (163 lb 5.8 oz)    SpO2: 98% 100% 93% 95%    Intake/Output Summary (Last 24 hours) at 08/17/13 1014 Last data filed at 08/17/13 0851  Gross per 24 hour  Intake      0 ml  Output      0 ml  Net      0 ml   Filed Weights   09/12/2013 2246  Weight: 74.1 kg (163 lb 5.8 oz)   Exam:  General:  Tachypneic  Cardiovascular: regular rate and rhythm, tachycardic  Respiratory: very coarse breath sounds, crackles up to midfields  Abdomen: positive bowel sounds  MSK: trace peripheral edema  Neuro: does not participate  Data Reviewed: Basic Metabolic Panel:  Recent Labs Lab  08-25-13 1934 08/17/13 0421  NA 140 144  K 4.4 4.1  CL 99 100  CO2 28 29  GLUCOSE 116* 126*  BUN 24* 25*  CREATININE 1.63* 1.56*  CALCIUM 9.2 9.2   Liver Function Tests:  Recent Labs Lab 08/25/13 1934  AST 30  ALT 16  ALKPHOS 44  BILITOT 0.6  PROT 7.4  ALBUMIN 3.7   CBC:  Recent Labs Lab Aug 25, 2013 1934 08/17/13 0421  WBC 5.9 10.7*  HGB 13.1 14.7  HCT 38.6 43.0  MCV 93.2 95.6  PLT 131* 133*   Cardiac Enzymes:  Recent Labs Lab Aug 25, 2013 1934  TROPONINI <0.30   BNP (last 3 results)  Recent Labs  07/30/13 1221  08/07/13 0947 25-Aug-2013 1934  PROBNP 3277.0* 388.0* 10920.0*   Studies: Dg Chest Port 1 View  08/17/2013   CLINICAL DATA:  Respiratory distress.  EXAM: PORTABLE CHEST - 1 VIEW  COMPARISON:  None.  FINDINGS: And cardiac enlargement is stable. Atherosclerotic calcifications are again noted at the aortic arch. There is now bilateral interstitial edema and small bilateral pleural effusions. Bibasilar airspace disease is evident.  IMPRESSION: 1. Cardiomegaly with new interstitial edema and bilateral pleural effusions compatible with congestive heart failure. 2. Bibasilar airspace disease. This is more prominent than expected for atelectasis. Infection is not excluded.   Electronically Signed   By: Gennette Pac M.D.   On: 08/17/2013 09:06   Dg Chest Port 1 View  2013/08/25   CLINICAL DATA:  Shortness of breath, cough, congestion  EXAM: PORTABLE CHEST - 1 VIEW  COMPARISON:  None.  FINDINGS: Moderate to severe cardiac enlargement. Vascular pattern normal. Aortic arch shows calcification. No consolidation or effusion identified.  IMPRESSION: Cardiac enlargement. Lungs appear clear. Retrocardiac portion of left lower lobe not well evaluated.   Electronically Signed   By: Esperanza Heir M.D.   On: Aug 25, 2013 19:10    Scheduled Meds: . allopurinol  100 mg Oral Daily  . aspirin  81 mg Oral Once  . atorvastatin  40 mg Oral q1800  . bisoprolol  10 mg Oral Daily  . cefTRIAXone (ROCEPHIN)  IV  1 g Intravenous Q24H  . donepezil  10 mg Oral QHS  . escitalopram  10 mg Oral Daily  . famotidine  20 mg Oral Daily  . furosemide  40 mg Intravenous BID  . heparin  5,000 Units Subcutaneous Q8H  . losartan  50 mg Oral Daily  . Memantine HCl ER  28 mg Oral Daily  . piperacillin-tazobactam (ZOSYN)  IV  4.5 g Intravenous STAT  . sodium chloride  3 mL Intravenous Q12H  . sodium chloride  3 mL Intravenous Q12H  . traZODone  50 mg Oral QHS   Continuous Infusions:   Principal Problem:   Acute respiratory  failure Active Problems:   Essential hypertension, benign   CKD (chronic kidney disease) stage 3, GFR 30-59 ml/min   CHF (congestive heart failure)  Time spent: 9  [name], MD [facility] Pager [phone]. If 7 PM - 7 AM, please contact night-coverage at [email or web address], password [facility] 08/17/2013, 10:14 AM  LOS: 1 day

## 2013-08-17 NOTE — Consult Note (Signed)
Name: [name] [name] MRN: [id number] DOB: 01/23/1920    ADMISSION DATE:  08/29/2013 CONSULTATION DATE:  08/17/2013  REFERRING MD :  Wyonia Houghgerghe PRIMARY SERVICE:  [facility]  CHIEF COMPLAINT:  Acute resp distress   HISTORY OF PRESENT ILLNESS:  [age] y.o woman with ischemic cardiomyopathy , EF 25% was adm 1/1 with worsening shortness of breath associated with productive cough of clear sputum and altered mental status x 3ds. Given extra dose of lasix at home. Adm CXr was clear, retrocardiac not seen well. Developed fever & progressive hypoxia , with am CXR showing BL infiltrates, also developed resp distress & severe hypoxia requiring NRB, rapid response called. On my arrival, increased dyspnea on NRB, BL coarse crackles  PMH -nonischemic cardiomyopathy, chronic systolic CHF, CKD stage III, HTN, HL, Alzheimer's dementia     PAST MEDICAL HISTORY :  Past Medical History  Diagnosis Date  . Hypertension   . CAD (coronary artery disease)     non-obstructive  . Alzheimer disease     Mild dementia  . Benign positional vertigo   . Degenerative joint disease   . Dyslipidemia     Mild  . Gout   . Renal insufficiency   . CM (congenital malformation)     non-ischemic CM, EF= 25-35% (echo 6/04)  . History of hypoparathyroidism   . Pulmonary edema     s/p ventilator-dependent respiratory failure, cardiac enzymes show no distinct pattern or elevation (6/04)  . Anxiety   . CHF (congestive heart failure)    Past Surgical History  Procedure Laterality Date  . Cesarean section    . Hernia repair    . Parathyroidectomy      Status post  . Cataract extraction, bilateral      Status post   Prior to Admission medications   Medication Sig Start Date End Date Taking? Authorizing Provider  allopurinol (ZYLOPRIM) 100 MG tablet Take 100 mg by mouth daily.   Yes Historical Provider, MD  aspirin 81 MG tablet Take 1 tablet (81 mg total) by mouth daily. 05/31/13  Yes Etta Grandchildhomas L Jones, MD  bisoprolol (ZEBETA) 10 MG  tablet Take 10 mg by mouth daily.   Yes Historical Provider, MD  donepezil (ARICEPT) 10 MG tablet Take 1 tablet (10 mg total) by mouth at bedtime. 05/31/13  Yes Etta Grandchildhomas L Jones, MD  escitalopram (LEXAPRO) 10 MG tablet Take 10 mg by mouth daily.   Yes Historical Provider, MD  furosemide (LASIX) 40 MG tablet Take 40 mg by mouth 2 (two) times daily.   Yes Historical Provider, MD  LORazepam (ATIVAN) 0.5 MG tablet Take 1 tablet (0.5 mg total) by mouth every 8 (eight) hours as needed for anxiety. 05/31/13  Yes Etta Grandchildhomas L Jones, MD  losartan (COZAAR) 50 MG tablet Take 50 mg by mouth daily.   Yes Historical Provider, MD  Memantine HCl ER (NAMENDA XR) 28 MG CP24 Take 28 mg by mouth daily. 05/31/13  Yes Etta Grandchildhomas L Jones, MD  Multiple Vitamin (MULTIVITAMIN) tablet Take 1 tablet by mouth daily.     Yes Historical Provider, MD  potassium chloride SA (K-DUR,KLOR-CON) 20 MEQ tablet Take 20 mEq by mouth daily as needed (when Lasix is 80mg ).   Yes Historical Provider, MD  ranitidine (ZANTAC) 75 MG tablet Take 75 mg by mouth daily.   Yes Historical Provider, MD  simvastatin (ZOCOR) 80 MG tablet Take 1 tablet (80 mg total) by mouth at bedtime. 05/31/13  Yes Etta Grandchildhomas L Jones, MD  traZODone (DESYREL) 50 MG tablet Take  1 tablet (50 mg total) by mouth at bedtime. 05/31/13  Yes Etta Grandchild, MD   Allergies  Allergen Reactions  . Benazepril     cough    FAMILY HISTORY:  Family History  Problem Relation Age of Onset  . Sudden death Mother 29  . Stroke Father    SOCIAL HISTORY:  reports that she has quit smoking. She has never used smokeless tobacco. She reports that she does not drink alcohol or use illicit drugs.  REVIEW OF SYSTEMS:   Unable to btain from pt due to resp distress As noted in h & P  SUBJECTIVE:   VITAL SIGNS: Temp:  [97.3 F (36.3 C)-100.6 F (38.1 C)] 100.6 F (38.1 C) (01/02 0433) Pulse Rate:  [76-128] 128 (01/02 0433) Resp:  [18-33] 25 (01/02 0433) BP: (107-166)/(57-93) 166/93 mmHg  (01/02 0433) SpO2:  [93 %-100 %] 95 % (01/02 0459) Weight:  [74.1 kg (163 lb 5.8 oz)] 74.1 kg (163 lb 5.8 oz) (01/01 2246)  PHYSICAL EXAMINATION: Gen.  well-nourished, in mod distress, anxious affect ENT - no lesions, no post nasal drip Neck: No JVD, no thyromegaly, no carotid bruits Lungs: no use of accessory muscles, no dullness to percussion, BL coarse rales , no rhonchi  Cardiovascular: Rhythm tachy, heart sounds  normal, no murmurs, no peripheral edema Abdomen: soft and non-tender, no hepatosplenomegaly, BS normal. Musculoskeletal: No deformities, no cyanosis or clubbing Neuro:  alert, non focal Skin:  Warm, no lesions/ rash    Recent Labs Lab 08/19/2013 1934 08/17/13 0421  NA 140 144  K 4.4 4.1  CL 99 100  CO2 28 29  BUN 24* 25*  CREATININE 1.63* 1.56*  GLUCOSE 116* 126*    Recent Labs Lab 08/24/2013 1934 08/17/13 0421  HGB 13.1 14.7  HCT 38.6 43.0  WBC 5.9 10.7*  PLT 131* 133*   Dg Chest Port 1 View  08/17/2013   CLINICAL DATA:  Respiratory distress.  EXAM: PORTABLE CHEST - 1 VIEW  COMPARISON:  None.  FINDINGS: And cardiac enlargement is stable. Atherosclerotic calcifications are again noted at the aortic arch. There is now bilateral interstitial edema and small bilateral pleural effusions. Bibasilar airspace disease is evident.  IMPRESSION: 1. Cardiomegaly with new interstitial edema and bilateral pleural effusions compatible with congestive heart failure. 2. Bibasilar airspace disease. This is more prominent than expected for atelectasis. Infection is not excluded.   Electronically Signed   By: Gennette Pac M.D.   On: 08/17/2013 09:06   Dg Chest Port 1 View  08/21/2013   CLINICAL DATA:  Shortness of breath, cough, congestion  EXAM: PORTABLE CHEST - 1 VIEW  COMPARISON:  None.  FINDINGS: Moderate to severe cardiac enlargement. Vascular pattern normal. Aortic arch shows calcification. No consolidation or effusion identified.  IMPRESSION: Cardiac enlargement. Lungs appear  clear. Retrocardiac portion of left lower lobe not well evaluated.   Electronically Signed   By: Esperanza Heir M.D.   On: 09/09/2013 19:10    ASSESSMENT / PLAN:  Acute resp failure - likley due to BL pneumonia from influenza Non ischemic cardiomyopathy, appears euvolemic clinically although BNp high   -Discussed with daughter & primary team. -DNR/I established - Would treat with oxygen, bipap prn - If worsens, would use morphine for comfort -empiric Abx for CAP coverage  PCCm available as needed  [facility]  Pulmonary and Critical Care Medicine [facility] Pager: [phone]  08/17/2013, 1:18 PM

## 2013-08-17 NOTE — Consult Note (Signed)
Patient ZO:XWRUE Hukill      DOB: [birth year]      AVW:[id number]     Consult Note from the Palliative Medicine Team at [facility]    Consult Requested by: Dr. Elvera Lennox     PCP: Sanda Linger, MD Reason for Consultation: Clarification GOC.    Phone Number:[phone]  Assessment of patients Current state: Ms. Popoca is a 80 yo female with ischemic cardiomyopathy, EF 25%, +influenza A, and worsening shortness of breath and increasing proBNP. Lasix has been given with little effect clinically. She is on non-rebreather mask. We had a discussion with her family about how serious her condition is now. They understand that she has influenza but we had to explain that there are limited treatments for influenza and that even Tamiflu is only available as a pill and that she may not be able to swallow po. We also explained the limited effect Tamiflu would have on her current situation and that it doesn't cure the influenza but may decrease length of coarse or slightly ease symptoms at best. They were very concerned that she was positive for the influenza and that nothing was being done. We explained that we are providing aggressive care to her and have not withheld any available treatments. They were concerned that she now had a palliative consult so treatment would be withheld. We clarified these misconceptions and explained that palliative care is cure and comfort. We explained that her condition is very serious and that it may take her life and soon - they all acknowledge this fact. We agree to continued treatment with high oxygen, antibiotics, Lasix and to see how she responds the next 24-48 hours. We will continue to follow and support.    Goals of Care: 1.  Code Status: DNR   2. Scope of Treatment: 1. Vital Signs: yes 2. Respiratory/Oxygen: yes 3. Antibiotics: yes 4. IVF: yes 5. Review of Medications to be discontinued: none 6. Labs: yes  7. Telemetry: yes  8. Consults: palliative   4.  Disposition: To be determined on outcomes.    3. Symptom Management:   1. Anxiety/Agitation: Ativan prn.  2. Pain/Dyspnea: Morphine prn.  3. Nausea/Vomiting: Ondansetron prn.   4. Psychosocial: Emotional support provided to patient and large family as difficult discussion was had.   5. Spiritual: Chaplain following.    Brief HPI: 80 yo female in acute respiratory distress.    ROS: unable to elicit - decreased LOC.     PMH:  Past Medical History  Diagnosis Date  . Hypertension   . CAD (coronary artery disease)     non-obstructive  . Alzheimer disease     Mild dementia  . Benign positional vertigo   . Degenerative joint disease   . Dyslipidemia     Mild  . Gout   . Renal insufficiency   . CM (congenital malformation)     non-ischemic CM, EF= 25-35% (echo 6/04)  . History of hypoparathyroidism   . Pulmonary edema     s/p ventilator-dependent respiratory failure, cardiac enzymes show no distinct pattern or elevation (6/04)  . Anxiety   . CHF (congestive heart failure)      PSH: Past Surgical History  Procedure Laterality Date  . Cesarean section    . Hernia repair    . Parathyroidectomy      Status post  . Cataract extraction, bilateral      Status post   I have reviewed the FH and SH and  If appropriate update it with  new information. Allergies  Allergen Reactions  . Benazepril     cough   Scheduled Meds: . allopurinol  100 mg Oral Daily  . aspirin  81 mg Oral Once  . atorvastatin  40 mg Oral q1800  . bisoprolol  10 mg Oral Daily  . cefTRIAXone (ROCEPHIN)  IV  1 g Intravenous Q24H  . donepezil  10 mg Oral QHS  . escitalopram  10 mg Oral Daily  . famotidine  20 mg Oral Daily  . furosemide  40 mg Intravenous BID  . heparin  5,000 Units Subcutaneous Q8H  . losartan  50 mg Oral Daily  . Memantine HCl ER  28 mg Oral Daily  . sodium chloride  3 mL Intravenous Q12H  . sodium chloride  3 mL Intravenous Q12H  . traZODone  50 mg Oral QHS   Continuous  Infusions:  PRN Meds:.sodium chloride, albuterol, LORazepam, ondansetron (ZOFRAN) IV, ondansetron, potassium chloride SA, sodium chloride    BP 166/93  Pulse 128  Temp(Src) 100.6 F (38.1 C) (Axillary)  Resp 25  Ht 5\' 2"  (1.575 m)  Wt 74.1 kg (163 lb 5.8 oz)  BMI 29.87 kg/m2  SpO2 95%   PPS: 30%   Intake/Output Summary (Last 24 hours) at 08/17/13 1326 Last data filed at 08/17/13 0851  Gross per 24 hour  Intake      0 ml  Output      0 ml  Net      0 ml    Physical Exam:  General: Respiratory distress, ill appearing  HEENT: [place]/AT, +JVD Chest: Rales throughout, labored breathes, symmetrical  CVS: RRR, tachycardic Abdomen: Soft, NT  Ext: No edema, warm to touch Neuro: Alert  Labs: CBC    Component Value Date/Time   WBC 10.7* 08/17/2013 0421   RBC 4.50 08/17/2013 0421   HGB 14.7 08/17/2013 0421   HCT 43.0 08/17/2013 0421   PLT 133* 08/17/2013 0421   MCV 95.6 08/17/2013 0421   MCH 32.7 08/17/2013 0421   MCHC 34.2 08/17/2013 0421   RDW 14.6 08/17/2013 0421   LYMPHSABS 1.4 05/31/2013 1403   MONOABS 0.3 05/31/2013 1403   EOSABS 0.1 05/31/2013 1403   BASOSABS 0.0 05/31/2013 1403    BMET    Component Value Date/Time   NA 144 08/17/2013 0421   K 4.1 08/17/2013 0421   CL 100 08/17/2013 0421   CO2 29 08/17/2013 0421   GLUCOSE 126* 08/17/2013 0421   BUN 25* 08/17/2013 0421   CREATININE 1.56* 08/17/2013 0421   CALCIUM 9.2 08/17/2013 0421   GFRNONAA 28* 08/17/2013 0421   GFRAA 32* 08/17/2013 0421    CMP     Component Value Date/Time   NA 144 08/17/2013 0421   K 4.1 08/17/2013 0421   CL 100 08/17/2013 0421   CO2 29 08/17/2013 0421   GLUCOSE 126* 08/17/2013 0421   BUN 25* 08/17/2013 0421   CREATININE 1.56* 08/17/2013 0421   CALCIUM 9.2 08/17/2013 0421   PROT 7.4 09/02/2013 1934   ALBUMIN 3.7 08/17/2013 1934   AST 30 09/10/2013 1934   ALT 16 09/03/2013 1934   ALKPHOS 44 08/25/2013 1934   BILITOT 0.6 09/04/2013 1934   GFRNONAA 28* 08/17/2013 0421   GFRAA 32* 08/17/2013 0421     Time In Time Out Total Time Spent  with Patient Total Overall Time  1200 1310 50min 70min    Greater than 50%  of this time was spent counseling and coordinating care related to the above assessment and  plan.  Yong Channel, NP Palliative Medicine Team Team Phone # [phone]

## 2013-08-17 NOTE — Progress Notes (Signed)
Chaplain responded to page from nurse concerning actively dying patient. Many family members were present and in the hall. They were very tearful. When asked if they needed support, they responded that they were supporting each other but would call if they needed anything. Family expressed thanks for visit.

## 2013-08-17 NOTE — Progress Notes (Signed)
Follow up visit - patient more alert - focusing to name - looking around room - seems to recognize daughter and other family members - decreased WOB - rate still around 24-28 with little accessory use but not as distressed - family asking about nutrition and drinking - explained that she is not alert enough right now and make cause more harm with risk of aspiration - Dr. Elvera Lennox updated and in agreement to keep NPO - oral care provided by RN Hessie Diener.  HR 88.  O2 sats 100% - to remain on NRB mask for now for comfort.  Will follow as needed.

## 2013-08-17 NOTE — Progress Notes (Signed)
Pt with rhonchi and course crackles labored breathing and saturation 80% on 2LNC and respirations 30/min. Pt very lethargic. Rapid response called, Dr Sharon Seller paged and Respiratory therapy called. Oral and nasal suction with copious amount of secretions, breathing treatment non re-breather at 15 liters and breathing treatment given. 40mg  of IV Lasix given Pts daughter phoned at 5:05 to let her know about her mother's condition

## 2013-08-17 NOTE — Progress Notes (Signed)
Rapid response has been notified regarding pt respiratory status. Nino Glow RN

## 2013-08-17 NOTE — Progress Notes (Signed)
Attending MD has been made pt remains in respiratory distress.  Ordered to continue to monitor pt and update MD with any changes in pt condition.   Nino Glow RN

## 2013-08-17 NOTE — Progress Notes (Signed)
Called by bedside RN regarding worsening respiratory status of patient. Upon arrival to floor, patient had been suctioned by RT, breathing treatment received, 40mg  IV lasix given, and family updated. Patient on NRB sats 97%, audible rhonchi, patient repositioned and suctioned. Patient using accessory muscles.  Advised bedside RN to updated MD on patient's status after lasix had time to take effect. Will continue to monitor, advised bedside RN to call with further needs.

## 2013-08-17 NOTE — Progress Notes (Signed)
Pt has received two doses of IV lasix as ordered by MD.  Foley inserted with out at this time.  Pt remains in respiratory distress using accessory muscles to breath, with respirations at 25.  Pt on non-rebreather sats in the upper 90's.  Daughter has been updated on pt status and states she is on her way to see the pt.  MD paged.   Nino Glow RN

## 2013-08-17 NOTE — Significant Event (Addendum)
Rapid Response Event Note  Overview:  Called to assist with patient with respiratory distress Time Called: 0758 Event Type: Respiratory  Initial Focused Assessment:  On arrival patient in bed with HOB up - skin hot and dry - barely responsive - can only raise eyebrows when name called - resps rapid and labored - using abdominal accessory muscles to breathe - rate 40-44 - audible wet sounding resps - bil BS present - rhonchi and rales both noted - abd soft - BP 138/95 HR 128 O2 sats 99% on NRB mask.  No real pedal edema noted.  Foley patent - cloudy yellow urine - RN reports patient has had 80 mg IV Lasix - he reports bed was soaked prior to insertion of foley - presumed from AM Lasix.  TRH 1 MD assigned to patient is at [facility] per Toni Amend RN - unable to see patient until later in AM.  Axillary temp this AM was 100.7 - patient very hot to touch- will get rectal temp when WOB is relieved.     Interventions:   Stat call to Company secretary for emergency  MD assignment from [facility].  Oral suction done - large amounts thick yellow secretions from back of throat - very weak cough present - dentures removed - stat call for [facility] - respiratory therapist here now - assisting with assessment of possibility of BiPAP - concern with airway protection - Dr. Elvera Lennox here - attempting to contact daughter - Dr. Raysal Lions present - additional 80 mg Lasix ordered and given per RN Allen  BP 112/58 HR 118 RR 40 - NTS 'd moderate amounts thick yellow secretions.  Placed on droplet precautions - flu swab pending.  Urine output remains low.  Daughter present - speaking with Dr. Lafe Garin - Dr. Vassie Loll with PCCM now present - speaking with daughter - 101/50 HR 106 RR 38 - patient to remain DNR/DNI.  MSo4 1 mg IV given per order.  Daughter updated and comforted.  Patient now becoming more responsive - looking around - eyes open - focusing - RR remains 38-42 - less audible rhonchi - still using accessory muscles but seems more relaxed.  Dr.  Vassie Loll orders to watch for one hour with MSO4 and reevaluate.  Handoff to [employer]  96/50 HR 103 RR 28  O2 sats 97% on NRB mask.     Event Summary: Name of Physician Notified: Dr. Lafe Garin at [phone]  Name of Consulting Physician Notified: Dr. Vassie Loll at [phone]  Outcome: Stayed in room and stabalized  Event End Time: 0915  [place]

## 2013-08-17 NOTE — Progress Notes (Signed)
Pt still on non rebth mask. Temp was 97.2 oral, Pt closed mouth at command for family

## 2013-08-17 NOTE — Progress Notes (Signed)
Utilization Review Completed Anchor Dwan J. Lesta Limbert, RN, BSN, NCM [phone]  

## 2013-08-17 NOTE — Progress Notes (Signed)
Noted fever on the chart after follow up. T max 101.6 F. UA and urine culture will be needed. Placed on empiric Rocephin in the mean time.  Debbora Presto, MD  [facility] Pager [phone] (cell)  If 7PM-7AM, please contact night-coverage [email or web address] Password [email or web address]

## 2013-08-17 NOTE — Progress Notes (Signed)
Pt stat drop to 85, gave PRn  Alb txt stat drop to 79-80%, gave PRN morphine n nasal suction Pt. CUrrent stat 93% on 15 l non -rebth. RR RN aware

## 2013-08-18 LAB — BASIC METABOLIC PANEL
BUN: 53 mg/dL — ABNORMAL HIGH (ref 6–23)
CO2: 22 mEq/L (ref 19–32)
Calcium: 8.9 mg/dL (ref 8.4–10.5)
Chloride: 100 mEq/L (ref 96–112)
Creatinine, Ser: 4.02 mg/dL — ABNORMAL HIGH (ref 0.50–1.10)
GFR calc non Af Amer: 9 mL/min — ABNORMAL LOW (ref >90)
GFR, EST AFRICAN AMERICAN: 10 mL/min — AB (ref >90)
Glucose, Bld: 113 mg/dL — ABNORMAL HIGH (ref 70–99)
Potassium: 4.5 mEq/L (ref 3.7–5.3)
Sodium: 143 mEq/L (ref 137–147)

## 2013-08-18 LAB — URINE CULTURE
Colony Count: NO GROWTH
Culture: NO GROWTH

## 2013-08-18 LAB — CBC
HCT: 44.1 % (ref 36.0–46.0)
HEMOGLOBIN: 15.3 g/dL — AB (ref 12.0–15.0)
MCH: 32.8 pg (ref 26.0–34.0)
MCHC: 34.7 g/dL (ref 30.0–36.0)
MCV: 94.6 fL (ref 78.0–100.0)
Platelets: 112 10*3/uL — ABNORMAL LOW (ref 150–400)
RBC: 4.66 MIL/uL (ref 3.87–5.11)
RDW: 14.2 % (ref 11.5–15.5)
WBC: 10.7 10*3/uL — ABNORMAL HIGH (ref 4.0–10.5)

## 2013-08-18 LAB — GLUCOSE, CAPILLARY: Glucose-Capillary: 138 mg/dL — ABNORMAL HIGH (ref 70–99)

## 2013-08-20 ENCOUNTER — Other Ambulatory Visit: Payer: Medicare Other

## 2013-08-21 ENCOUNTER — Telehealth: Payer: Self-pay

## 2013-08-21 NOTE — Telephone Encounter (Signed)
Tammy with [facility] Department called [facility]movm requesting a call back, need to know if pt received a flu shot. Returned call back to Tammy and confirmed her immunization date of 05/31/13.

## 2013-09-16 NOTE — Progress Notes (Deleted)
PROGRESS NOTE  [name] [name] WJX:[id number] DOB: 05/03/1920 DOA: 08/28/2013 PCP: Sanda Linger, MD  Assessment/Plan: Acute respiratory failure - multifactorial CHF, ?PNA - worsening respiratory status 1/2 in the morning; patient admitted as DNR/DNI but when family informed over the phone regarding decline they wanted to rethink code status. PCCM was consulted then and after family discussions decided to continue DNR/DNI but full course of medical treatment with antibiotics ans supportive treatment.  - continue supportive treatment, patient continues to decline - flu positive, Tamiflu ordered but unable to swallow po.  - continue Ceftriaxone/Azithromycin  CHF - advanced, cardiology consulted. S/p Lasix yesterday for respiratory failure, worsening renal function this morning. No scheduled Lasix for now. Monitor.  HTN -  CKD - worsening renal function. Poor prognosis.  Functional quadriplegia  Alzheimer dementia   Goals of care: Patient is critically ill. She has severe heart failure, renal failure, influenza and hypoxic respiratory failure in the setting of advanced dementia. She may not survive this hospitalization. Family aware of her prognosis.   Diet: NPO Fluids: none DVT Prophylaxis: heparin  Code Status: DNR Family Communication: daughter  Disposition Plan: inpatient  Consultants:  PCCM  Cardiology  Palliative  Procedures:  2D echo Study Conclusions - Left ventricle: There is diffuse hypokinesis with akinesis of the basal and mid inferior and inferoseptall walls, and apical inferior wall. The cavity size was normal. There was mild concentric hypertrophy. Systolic function was severely reduced. The estimated ejection fraction was in the range of 20% to 25%. Wall motion was normal; there were no regional wall motion abnormalities. The study is not technically sufficient to allow evaluation of LV diastolic function. Doppler parameters are consistent with elevated ventricular  end-diastolic filling pressure.   Antibiotics  Anti-infectives   Start     Dose/Rate Route Frequency Ordered Stop   08/17/13 1500  azithromycin (ZITHROMAX) 500 mg in dextrose 5 % 250 mL IVPB     500 mg 250 mL/hr over 60 Minutes Intravenous Every 24 hours 08/17/13 1408     08/17/13 1430  oseltamivir (TAMIFLU) capsule 30 mg     30 mg Oral Daily 08/17/13 1332 08/22/13 0959   08/17/13 0830  vancomycin (VANCOCIN) IVPB 1000 mg/200 mL premix     1,000 mg 200 mL/hr over 60 Minutes Intravenous STAT 08/17/13 0823 08/17/13 1004   08/17/13 0830  piperacillin-tazobactam (ZOSYN) IVPB 4.5 g     4.5 g 200 mL/hr over 30 Minutes Intravenous STAT 08/17/13 0823 08/17/13 1052   08/17/13 0500  cefTRIAXone (ROCEPHIN) 1 g in dextrose 5 % 50 mL IVPB     1 g 100 mL/hr over 30 Minutes Intravenous Every 24 hours 08/17/13 0438       Antibiotics Given (last 72 hours)   Date/Time Action Medication Dose Rate   08/17/13 0724 Given   cefTRIAXone (ROCEPHIN) 1 g in dextrose 5 % 50 mL IVPB 1 g 100 mL/hr   08/17/13 0904 Given   vancomycin (VANCOCIN) IVPB 1000 mg/200 mL premix 1,000 mg 200 mL/hr   08/17/13 1022 Given   piperacillin-tazobactam (ZOSYN) IVPB 4.5 g 4.5 g 200 mL/hr   08/17/13 1529 Given   azithromycin (ZITHROMAX) 500 mg in dextrose 5 % 250 mL IVPB 500 mg 250 mL/hr   08/16/2013 0406 Given   cefTRIAXone (ROCEPHIN) 1 g in dextrose 5 % 50 mL IVPB 1 g 100 mL/hr     HPI/Subjective: - little response, opens eyes but does not follow commands  Objective: Filed Vitals:   08/17/13 1800 08/17/13 1834  08/17/13 2048 Sep 10, 2013 0557  BP: 129/58 148/83 99/54 143/64  Pulse:  98 97 75  Temp:  98 F (36.7 C) 98 F (36.7 C) 99.3 F (37.4 C)  TempSrc:  Axillary Axillary Axillary  Resp:    18  Height:      Weight:    74.8 kg (164 lb 14.5 oz)  SpO2:  97% 98% 95%    Intake/Output Summary (Last 24 hours) at 10-Sep-2013 1107 Last data filed at 08/17/13 2300  Gross per 24 hour  Intake      0 ml  Output    275 ml    Net   -275 ml   Filed Weights   09/03/2013 2246 2013/09/10 0557  Weight: 74.1 kg (163 lb 5.8 oz) 74.8 kg (164 lb 14.5 oz)   Exam:  General:  Tachypneic  Cardiovascular: regular rate and rhythm, tachycardic  Respiratory: very coarse breath sounds, crackles up to midfields  Abdomen: positive bowel sounds  MSK: trace peripheral edema  Neuro: does not participate  Data Reviewed: Basic Metabolic Panel:  Recent Labs Lab 09/11/2013 1934 08/17/13 0421 2013/09/10 0445  NA 140 144 143  K 4.4 4.1 4.5  CL 99 100 100  CO2 28 29 22   GLUCOSE 116* 126* 113*  BUN 24* 25* 53*  CREATININE 1.63* 1.56* 4.02*  CALCIUM 9.2 9.2 8.9   Liver Function Tests:  Recent Labs Lab 09/15/2013 1934  AST 30  ALT 16  ALKPHOS 44  BILITOT 0.6  PROT 7.4  ALBUMIN 3.7   CBC:  Recent Labs Lab 09/04/2013 1934 08/17/13 0421 09-10-2013 0445  WBC 5.9 10.7* 10.7*  HGB 13.1 14.7 15.3*  HCT 38.6 43.0 44.1  MCV 93.2 95.6 94.6  PLT 131* 133* 112*   Cardiac Enzymes:  Recent Labs Lab 08/28/2013 1934  TROPONINI <0.30   BNP (last 3 results)  Recent Labs  07/30/13 1221 08/07/13 0947 08/17/2013 1934  PROBNP 3277.0* 388.0* 10920.0*   Studies: Dg Chest Port 1 View  08/17/2013   CLINICAL DATA:  Respiratory distress.  EXAM: PORTABLE CHEST - 1 VIEW  COMPARISON:  None.  FINDINGS: And cardiac enlargement is stable. Atherosclerotic calcifications are again noted at the aortic arch. There is now bilateral interstitial edema and small bilateral pleural effusions. Bibasilar airspace disease is evident.  IMPRESSION: 1. Cardiomegaly with new interstitial edema and bilateral pleural effusions compatible with congestive heart failure. 2. Bibasilar airspace disease. This is more prominent than expected for atelectasis. Infection is not excluded.   Electronically Signed   By: Gennette Pac M.D.   On: 08/17/2013 09:06   Dg Chest Port 1 View  08/28/2013   CLINICAL DATA:  Shortness of breath, cough, congestion  EXAM: PORTABLE  CHEST - 1 VIEW  COMPARISON:  None.  FINDINGS: Moderate to severe cardiac enlargement. Vascular pattern normal. Aortic arch shows calcification. No consolidation or effusion identified.  IMPRESSION: Cardiac enlargement. Lungs appear clear. Retrocardiac portion of left lower lobe not well evaluated.   Electronically Signed   By: Esperanza Heir M.D.   On: 08/22/2013 19:10    Scheduled Meds: . allopurinol  100 mg Oral Daily  . aspirin  81 mg Oral Once  . atorvastatin  40 mg Oral q1800  . atropine  2 drop Sublingual TID  . azithromycin  500 mg Intravenous Q24H  . bisoprolol  10 mg Oral Daily  . cefTRIAXone (ROCEPHIN)  IV  1 g Intravenous Q24H  . donepezil  10 mg Oral QHS  . escitalopram  10 mg  Oral Daily  . famotidine  20 mg Oral Daily  . heparin  5,000 Units Subcutaneous Q8H  . Memantine HCl ER  28 mg Oral Daily  . oseltamivir  30 mg Oral Daily  . sodium chloride  3 mL Intravenous Q12H  . sodium chloride  3 mL Intravenous Q12H  . traZODone  50 mg Oral QHS   Continuous Infusions:   Principal Problem:   Acute respiratory failure Active Problems:   Essential hypertension, benign   CKD (chronic kidney disease) stage 3, GFR 30-59 ml/min   CHF (congestive heart failure)   Palliative care encounter   Influenza due to identified novel influenza A virus with other respiratory manifestations  Time spent: 35  Pamella Pertostin Areya Lemmerman, MD [facility] Pager [phone]. If 7 PM - 7 AM, please contact night-coverage at [email or web address], password [email or web address] 08/26/2013, 11:07 AM  LOS: 2 days

## 2013-09-16 NOTE — Discharge Summary (Addendum)
Physician Death Summary  Alver Sorrowaomi Dwan ZOX:[id number] DOB: 09/07/1919 DOA: 08/31/2013  PCP: Sanda Lingerhomas Jones, MD  Admit date: 09/13/2013 Death date: 09/05/2013  Time spent: 35 minutes  Discharge Diagnoses:  Principal Problem:   Acute respiratory failure Active Problems:   Essential hypertension, benign   CKD (chronic kidney disease) stage 3, GFR 30-59 ml/min   CHF (congestive heart failure)   Palliative care encounter   Influenza due to identified novel influenza A virus with other respiratory manifestations  History of present illness:  Pt is 80 yo female with a nonischemic cardiomyopathy, chronic systolic CHF, CKD stage III, HTN, HL, Alzheimer's dementia [facility] with main concern of progressively worsening shortness of breath associated with productive cough of clear sputum and altered mental status that was first noted by her daughter who is HCPOA, 2-3 days prior to this admission. Per daughter, pt noted to have slight increase in weight and daughter gave extra dose of Lasix with no specific improvement in symptoms. There is no reported chest pain, no specific abdominal or urinary concerns, no specific focal neurological symptoms. Last heart cath (02/2003): Mid left main 20%, proximal-mid RCA 30-40%. Echocardiogram (01/2003): EF 25-35%, apical and anterior HK, septal HK-AK, mild AI, mild ascending aortic dilatation, MAC, LA upper limits of normal. In ED, pt hemodynamically stable. Rhonchi noted on exam, cardiology consulted by ED doctor. Telemetry bed requested.  Hospital Course:  Acute respiratory failure - multifactorial CHF, PNA. Patient with worsening respiratory status shortly after admission despite Lasix and antibiotics. PCCM and cardiology were consulted given advanced heart failure and hypoxic respiratory failure. Dr. Antoine PocheHochrein from cardiology, Dr. Vassie LollAlva from PCCM and myself have had discussion with the family regarding her critical situation and that she might not survive this  hospitalization. After talking to her family, they decided to continue DNR/DNI as per admission orders and to continue supportive measures. Patient received IV antibiotics and Lasix to help with fluid overload which was evident on physical exam and CXR. She was made strict NPO given her altered mental state and inability to safely swallow oral medications or food. Consult for SLP was placed. Despite these measures, patient's condition continued to deteriorate and she passed away on 08/18/2012.  Acute on chronic systolic CHF - advanced, EF 20-25% Positive influenza HTN  Acute on chronic renal failure - Cr from 1.5 to 4 with diuresis Functional quadriplegia  Advanced Alzheimer dementia   Procedures:  2d echo  Study Conclusions - Left ventricle: There is diffuse hypokinesis with akinesis of the basal and mid inferior and inferoseptall walls, and apical inferior wall. The cavity size was normal. There was mild concentric hypertrophy. Systolic function was severely reduced. The estimated ejection fraction was in the range of 20% to 25%. Wall motion was normal; there were no regional wall motion abnormalities. The study is not technically sufficient to allow evaluation of LV diastolic function. Doppler parameters are consistent with elevated ventricular end-diastolic filling pressure. - Aortic valve: The right aortic valve leaflet has focal thickening at the tip of the leaflet. Moderate regurgitation. - Mitral valve: Mild regurgitation. - Right ventricle: Systolic function was mildly reduced.  The results of significant diagnostics from this hospitalization (including imaging, microbiology, ancillary and laboratory) are listed below for reference.    Significant Diagnostic Studies: Dg Chest Port 1 View  08/17/2013   CLINICAL DATA:  Respiratory distress.  EXAM: PORTABLE CHEST - 1 VIEW  COMPARISON:  None.  FINDINGS: And cardiac enlargement is stable. Atherosclerotic calcifications are again noted  at the  aortic arch. There is now bilateral interstitial edema and small bilateral pleural effusions. Bibasilar airspace disease is evident.  IMPRESSION: 1. Cardiomegaly with new interstitial edema and bilateral pleural effusions compatible with congestive heart failure. 2. Bibasilar airspace disease. This is more prominent than expected for atelectasis. Infection is not excluded.   Electronically Signed   By: Gennette Pac M.D.   On: 08/17/2013 09:06   Dg Chest Port 1 View  09/01/2013   CLINICAL DATA:  Shortness of breath, cough, congestion  EXAM: PORTABLE CHEST - 1 VIEW  COMPARISON:  None.  FINDINGS: Moderate to severe cardiac enlargement. Vascular pattern normal. Aortic arch shows calcification. No consolidation or effusion identified.  IMPRESSION: Cardiac enlargement. Lungs appear clear. Retrocardiac portion of left lower lobe not well evaluated.   Electronically Signed   By: Esperanza Heir M.D.   On: 09/08/2013 19:10    Microbiology: Recent Results (from the past 240 hour(s))  URINE CULTURE     Status: None   Collection Time    08/17/13  8:10 AM      Result Value Range Status   Specimen Description URINE, CATHETERIZED   Final   Special Requests NONE   Final   Culture  Setup Time     Final   Value: 08/17/2013 08:49     Performed at [facility]   Colony Count     Final   Value: NO GROWTH     Performed at [facility]   Culture     Final   Value: NO GROWTH     Performed at [facility]   Report Status 2013-09-13 FINAL   Final     Labs: Basic Metabolic Panel:  Recent Labs Lab 08/23/2013 1934 08/17/13 0421 2013-09-13 0445  NA 140 144 143  K 4.4 4.1 4.5  CL 99 100 100  CO2 28 29 22   GLUCOSE 116* 126* 113*  BUN 24* 25* 53*  CREATININE 1.63* 1.56* 4.02*  CALCIUM 9.2 9.2 8.9   Liver Function Tests:  Recent Labs Lab 09/05/2013 1934  AST 30  ALT 16  ALKPHOS 44  BILITOT 0.6  PROT 7.4  ALBUMIN 3.7   CBC:  Recent Labs Lab 08/23/2013 1934 08/17/13 0421  09/13/2013 0445  WBC 5.9 10.7* 10.7*  HGB 13.1 14.7 15.3*  HCT 38.6 43.0 44.1  MCV 93.2 95.6 94.6  PLT 131* 133* 112*   Cardiac Enzymes:  Recent Labs Lab 08/31/2013 1934  TROPONINI <0.30   BNP: BNP (last 3 results)  Recent Labs  07/30/13 1221 08/07/13 0947 09/05/2013 1934  PROBNP 3277.0* 388.0* 10920.0*   CBG:  Recent Labs Lab 08/17/13 1057 08/17/13 1342 08/17/13 1711 08/17/13 2104 09-13-2013 1057  GLUCAP 149* 146* 203* 136* 138*   Signed:  Taitum Alms  [facility] 09-13-13, 3:18 PM

## 2013-09-16 NOTE — Consult Note (Signed)
I have reviewed and discussed the care of this patient in detail with the nurse practitioner including pertinent patient records, physical exam findings and data. I agree with details of this encounter.  

## 2013-09-16 NOTE — Progress Notes (Signed)
SUBJECTIVE:  Progressive respiratory decline.  She opens her eyes   PHYSICAL EXAM Filed Vitals:   08/17/13 1800 08/17/13 1834 08/17/13 2048 08-22-2013 0557  BP: 129/58 148/83 99/54 143/64  Pulse:  98 97 75  Temp:  98 F (36.7 C) 98 F (36.7 C) 99.3 F (37.4 C)  TempSrc:  Axillary Axillary Axillary  Resp:    18  Height:      Weight:    164 lb 14.5 oz (74.8 kg)  SpO2:  97% 98% 95%   General:  Minimally responsive Lungs: Decreased breath sounds bilaterally Heart:  Tachy Abdomen:  Positive bowel sounds, no rebound no guarding Extremities:  No edema  LABS:  Results for orders placed during the hospital encounter of 09/14/2013 (from the past 24 hour(s))  URINALYSIS, ROUTINE W REFLEX MICROSCOPIC     Status: Abnormal   Collection Time    08/17/13  8:10 AM      Result Value Range   Color, Urine YELLOW  YELLOW   APPearance CLOUDY (*) CLEAR   Specific Gravity, Urine 1.016  1.005 - 1.030   pH 5.0  5.0 - 8.0   Glucose, UA NEGATIVE  NEGATIVE mg/dL   Hgb urine dipstick SMALL (*) NEGATIVE   Bilirubin Urine NEGATIVE  NEGATIVE   Ketones, ur NEGATIVE  NEGATIVE mg/dL   Protein, ur 553 (*) NEGATIVE mg/dL   Urobilinogen, UA 1.0  0.0 - 1.0 mg/dL   Nitrite NEGATIVE  NEGATIVE   Leukocytes, UA NEGATIVE  NEGATIVE  URINE MICROSCOPIC-ADD ON     Status: Abnormal   Collection Time    08/17/13  8:10 AM      Result Value Range   WBC, UA 0-2  <3 WBC/hpf   RBC / HPF 0-2  <3 RBC/hpf   Bacteria, UA FEW (*) RARE   Casts GRANULAR CAST (*) NEGATIVE   Urine-Other AMORPHOUS URATES/PHOSPHATES    GLUCOSE, CAPILLARY     Status: Abnormal   Collection Time    08/17/13 10:57 AM      Result Value Range   Glucose-Capillary 149 (*) 70 - 99 mg/dL   Comment 1 Notify RN    GLUCOSE, CAPILLARY     Status: Abnormal   Collection Time    08/17/13  1:42 PM      Result Value Range   Glucose-Capillary 146 (*) 70 - 99 mg/dL   Comment 1 Notify RN    GLUCOSE, CAPILLARY     Status: Abnormal   Collection Time   08/17/13  5:11 PM      Result Value Range   Glucose-Capillary 203 (*) 70 - 99 mg/dL   Comment 1 Notify RN    GLUCOSE, CAPILLARY     Status: Abnormal   Collection Time    08/17/13  9:04 PM      Result Value Range   Glucose-Capillary 136 (*) 70 - 99 mg/dL  CBC     Status: Abnormal   Collection Time    22-Aug-2013  4:45 AM      Result Value Range   WBC 10.7 (*) 4.0 - 10.5 K/uL   RBC 4.66  3.87 - 5.11 MIL/uL   Hemoglobin 15.3 (*) 12.0 - 15.0 g/dL   HCT 74.8  27.0 - 78.6 %   MCV 94.6  78.0 - 100.0 fL   MCH 32.8  26.0 - 34.0 pg   MCHC 34.7  30.0 - 36.0 g/dL   RDW 75.4  49.2 - 01.0 %   Platelets 112 (*) 150 -  400 K/uL  BASIC METABOLIC PANEL     Status: Abnormal   Collection Time    2014/04/06  4:45 AM      Result Value Range   Sodium 143  137 - 147 mEq/L   Potassium 4.5  3.7 - 5.3 mEq/L   Chloride 100  96 - 112 mEq/L   CO2 22  19 - 32 mEq/L   Glucose, Bld 113 (*) 70 - 99 mg/dL   BUN 53 (*) 6 - 23 mg/dL   Creatinine, Ser 1.614.02 (*) 0.50 - 1.10 mg/dL   Calcium 8.9  8.4 - 09.610.5 mg/dL   GFR calc non Af Amer 9 (*) >90 mL/min   GFR calc Af Amer 10 (*) >90 mL/min    Intake/Output Summary (Last 24 hours) at 2014/04/06 [id number] Last data filed at 08/17/13 2300  Gross per 24 hour  Intake      0 ml  Output    275 ml  Net   -275 ml     ASSESSMENT AND PLAN:  CHRONIC SYSTOLIC HF:  EF 09%25% by echo (essentially unchanged from previous).  Unchanged from previous.  No improvement with diuresis.  Now with renal failure.  Discontinue ACE inhibitor and PO Lasix.   No further cardiac work up or change in therapy.   Supportive care as provided.  Please call us with further questions.     Rollene RotundaJames Delane Stalling 08/17/2013 7:38 AM

## 2013-09-16 NOTE — Progress Notes (Signed)
Chaplain was paged to be with family after the passing of pt.  Chaplain arrived and talked with the family for a bit before sharing in prayer with them.  Chaplain offered condolences and family seemed grateful for the gesture.

## 2013-09-16 NOTE — Progress Notes (Signed)
[facility] called. Pt not a candidate. #[phone]

## 2013-09-16 DEATH — deceased

## 2013-09-21 ENCOUNTER — Ambulatory Visit: Payer: Medicare Other | Admitting: Cardiology

## 2013-11-27 ENCOUNTER — Ambulatory Visit: Payer: Medicare Other | Admitting: Internal Medicine

## 2014-09-19 IMAGING — DX DG CHEST 1V PORT
1 series · 1 of 1 positions shown · non-contrast
Comparison: None.

CLINICAL DATA: Respiratory distress.

EXAM:
PORTABLE CHEST - 1 VIEW

[portable]
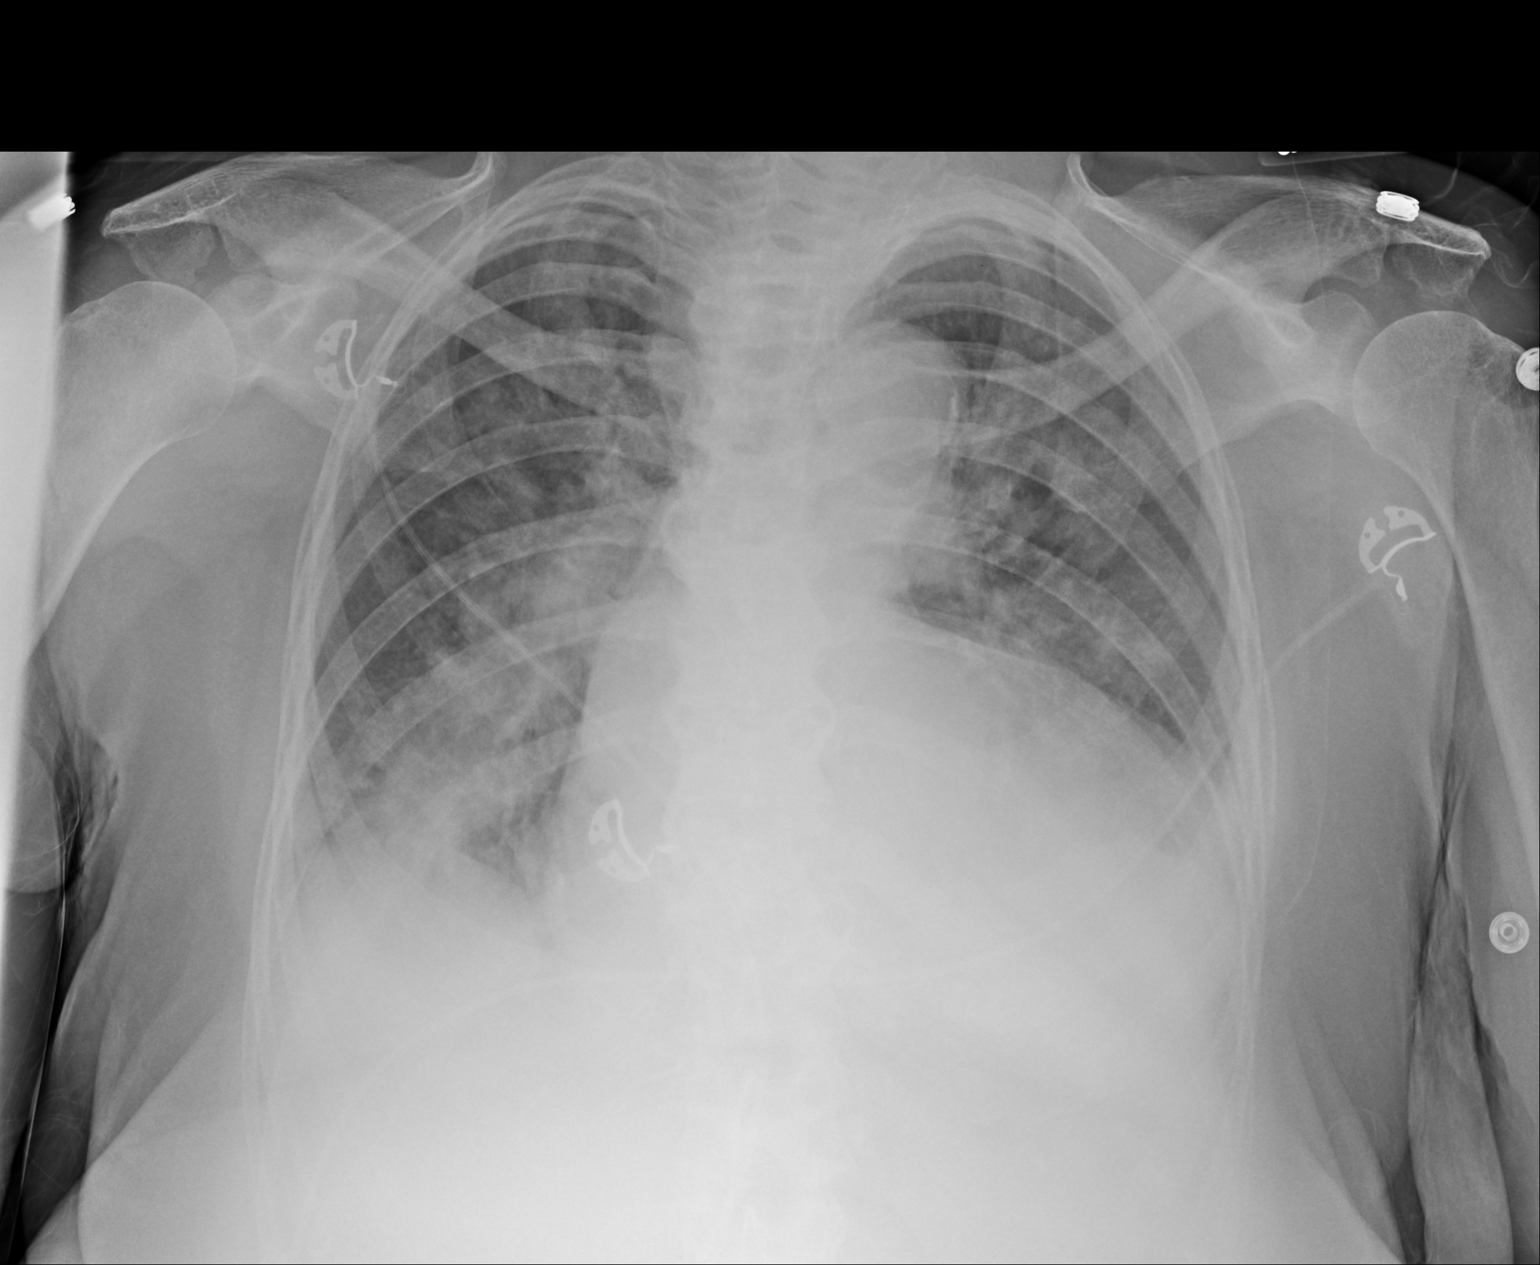

[1 of 1 positions shown; findings below may reference images not displayed]

FINDINGS: And cardiac enlargement is stable. Atherosclerotic calcifications
are again noted at the aortic arch. There is now bilateral
interstitial edema and small bilateral pleural effusions. Bibasilar
airspace disease is evident.
IMPRESSION: 1. Cardiomegaly with new interstitial edema and bilateral pleural
effusions compatible with congestive heart failure.
2. Bibasilar airspace disease. This is more prominent than expected
for atelectasis. Infection is not excluded.
